# Patient Record
Sex: Female | Born: 2000 | Race: Black or African American | Hispanic: No | Marital: Single | Smoking: Never smoker
Health system: Southern US, Community
[De-identification: ages and names within clinical notes are randomized; demographics above are authoritative.]

## PROBLEM LIST (undated history)

## (undated) DIAGNOSIS — O24419 Gestational diabetes mellitus in pregnancy, unspecified control: Secondary | ICD-10-CM

## (undated) DIAGNOSIS — E669 Obesity, unspecified: Secondary | ICD-10-CM

## (undated) DIAGNOSIS — Z789 Other specified health status: Secondary | ICD-10-CM

## (undated) HISTORY — DX: Other specified health status: Z78.9

## (undated) HISTORY — DX: Obesity, unspecified: E66.9

## (undated) HISTORY — PX: NO PAST SURGERIES: SHX2092

---
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
## 2019-03-02 ENCOUNTER — Emergency Department (HOSPITAL_COMMUNITY): Payer: Medicaid Other

## 2019-03-02 ENCOUNTER — Emergency Department (HOSPITAL_COMMUNITY)
Admission: EM | Admit: 2019-03-02 | Discharge: 2019-03-02 | Disposition: A | Discharge status: Home / Self Care | Payer: Medicaid Other | Attending: Emergency Medicine | Admitting: Emergency Medicine

## 2019-03-02 ENCOUNTER — Telehealth: Payer: Self-pay | Admitting: *Deleted

## 2019-03-02 ENCOUNTER — Other Ambulatory Visit: Payer: Self-pay

## 2019-03-02 DIAGNOSIS — Z79899 Other long term (current) drug therapy: Secondary | ICD-10-CM | POA: Insufficient documentation

## 2019-03-02 DIAGNOSIS — Z8781 Personal history of (healed) traumatic fracture: Secondary | ICD-10-CM | POA: Diagnosis not present

## 2019-03-02 DIAGNOSIS — M25531 Pain in right wrist: Secondary | ICD-10-CM | POA: Insufficient documentation

## 2019-03-02 DIAGNOSIS — M546 Pain in thoracic spine: Secondary | ICD-10-CM | POA: Diagnosis not present

## 2019-03-02 DIAGNOSIS — M25561 Pain in right knee: Secondary | ICD-10-CM | POA: Insufficient documentation

## 2019-03-02 DIAGNOSIS — M545 Low back pain, unspecified: Secondary | ICD-10-CM

## 2019-03-02 DIAGNOSIS — M79631 Pain in right forearm: Secondary | ICD-10-CM | POA: Diagnosis present

## 2019-03-02 DIAGNOSIS — M25539 Pain in unspecified wrist: Secondary | ICD-10-CM

## 2019-03-02 LAB — POC URINE PREG, ED: Preg Test, Ur: NEGATIVE

## 2019-03-02 MED ORDER — METHOCARBAMOL 500 MG PO TABS: 500.0000 mg | ORAL_TABLET | Freq: Two times a day (BID) | ORAL | 0 refills | Status: DC

## 2019-03-02 MED ORDER — METHOCARBAMOL 500 MG PO TABS
500.0000 mg | ORAL_TABLET | Freq: Once | ORAL | Status: AC
  Administered 2019-03-02: 11:00:00 500 mg via ORAL
  Filled 2019-03-02: qty 1

## 2019-03-02 MED ORDER — NAPROXEN 500 MG PO TABS: 500.0000 mg | ORAL_TABLET | Freq: Two times a day (BID) | ORAL | 0 refills | Status: DC

## 2019-03-02 MED ORDER — ACETAMINOPHEN 325 MG PO TABS
650.0000 mg | ORAL_TABLET | Freq: Once | ORAL | Status: AC
  Administered 2019-03-02: 11:00:00 650 mg via ORAL
  Filled 2019-03-02: qty 2
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
## 2019-03-02 NOTE — Telephone Encounter (Signed)
ED TOC CM -  Received call from pt's mother and requesting information on Xray. Pt gave permisison to speak to mother, Jacqueline Yu. Explained she will need to contact ortho MD and scheduled appt. Ortho MD will go over results of exams performed in ED. If she needs a copy of records, she will need to go through [facility] medical records or mychart to obtain records. Isidoro Donning RN CCM Case Mgmt phone [phone]
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
## 2019-03-02 NOTE — ED Notes (Signed)
Patient transported to X-ray 
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
## 2019-03-05 ENCOUNTER — Ambulatory Visit (INDEPENDENT_AMBULATORY_CARE_PROVIDER_SITE_OTHER): Payer: Medicaid Other | Admitting: Family Medicine

## 2019-03-05 ENCOUNTER — Encounter: Payer: Self-pay | Admitting: Family Medicine

## 2019-03-05 ENCOUNTER — Other Ambulatory Visit: Payer: Self-pay

## 2019-03-05 DIAGNOSIS — M25532 Pain in left wrist: Secondary | ICD-10-CM | POA: Diagnosis present
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
## 2019-03-09 ENCOUNTER — Encounter (INDEPENDENT_AMBULATORY_CARE_PROVIDER_SITE_OTHER): Payer: Self-pay | Admitting: Orthopaedic Surgery

## 2019-03-09 ENCOUNTER — Other Ambulatory Visit: Payer: Self-pay

## 2019-03-09 ENCOUNTER — Ambulatory Visit (INDEPENDENT_AMBULATORY_CARE_PROVIDER_SITE_OTHER): Payer: Medicaid Other | Admitting: Orthopaedic Surgery

## 2019-03-09 VITALS — Ht 64.0 in | Wt 209.0 lb

## 2019-03-09 DIAGNOSIS — M25532 Pain in left wrist: Secondary | ICD-10-CM | POA: Insufficient documentation

## 2019-03-09 DIAGNOSIS — M25561 Pain in right knee: Secondary | ICD-10-CM | POA: Diagnosis not present

## 2019-03-09 DIAGNOSIS — M79601 Pain in right arm: Secondary | ICD-10-CM | POA: Diagnosis not present

## 2019-03-09 MED ORDER — METHYLPREDNISOLONE 4 MG PO TBPK: ORAL_TABLET | ORAL | 0 refills | Status: DC

## 2019-03-09 MED ORDER — DICLOFENAC SODIUM 1 % TD GEL: 2.0000 g | Freq: Four times a day (QID) | TRANSDERMAL | 1 refills | Status: DC
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
## 2019-12-09 ENCOUNTER — Telehealth: Payer: Self-pay
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
## 2020-01-15 ENCOUNTER — Telehealth (INDEPENDENT_AMBULATORY_CARE_PROVIDER_SITE_OTHER): Payer: Medicaid Other | Admitting: Family Medicine

## 2020-01-15 ENCOUNTER — Other Ambulatory Visit: Payer: Self-pay

## 2020-01-15 DIAGNOSIS — U071 COVID-19: Secondary | ICD-10-CM

## 2020-01-15 NOTE — Assessment & Plan Note (Signed)
Patient reassured about Covid 19 and given recommendations.  Counseled patient on taking regular Mucinex versus Mucinex DM as she is already taking over-the-counter flu medication.  She expressed understanding.  After brief literature review, there are no published studies that show harm of vitamin D or elderberry during Covid.  Patient to call or MyChart message if she has any further questions.  ED precautions provided.

## 2020-01-15 NOTE — Progress Notes (Signed)
[facility] Telemedicine Visit  Patient consented to have virtual visit. Method of visit: Video  Encounter participants: Patient: Jacqueline Yu - located at Home Provider: Melene Plan - located at [facility] Others (if applicable): Patient's mom   Chief Complaint: Covid diagnosis  HPI:  Patient calling today to ask questions about Covid diagnosis.  She is wondering what medication she can take and what to expect.  She is currently taking over-the-counter flu medication (phenylephrine, diphenhydramine, acetaminophen) as needed for symptom relief.  She is wondering if she can add Mucinex to this combination.  She is also wondering if elderberry and vitamin D are contraindicated during Covid infection.    ROS: per HPI  Pertinent PMHx: No significant past medical history  Exam:  Patient appears well over video.  She has nasal congestion, breathing normally with no acute distress.  Speaking in full sentences.  No coughing appreciated.  Assessment/Plan:  No problem-specific Assessment & Plan notes found for this encounter.    Time spent during visit with patient: 14 minutes

## 2020-03-27 ENCOUNTER — Encounter: Payer: Self-pay | Admitting: Family Medicine

## 2020-03-28 ENCOUNTER — Ambulatory Visit (INDEPENDENT_AMBULATORY_CARE_PROVIDER_SITE_OTHER): Payer: Medicaid Other | Admitting: Family Medicine

## 2020-03-28 ENCOUNTER — Other Ambulatory Visit: Payer: Self-pay

## 2020-03-28 ENCOUNTER — Encounter: Payer: Self-pay | Admitting: Family Medicine

## 2020-03-28 VITALS — BP 138/82 | HR 86 | Ht 64.0 in | Wt 224.8 lb

## 2020-03-28 DIAGNOSIS — Z0000 Encounter for general adult medical examination without abnormal findings: Secondary | ICD-10-CM

## 2020-03-28 DIAGNOSIS — Z131 Encounter for screening for diabetes mellitus: Secondary | ICD-10-CM

## 2020-03-28 DIAGNOSIS — Z111 Encounter for screening for respiratory tuberculosis: Secondary | ICD-10-CM | POA: Diagnosis not present

## 2020-03-28 DIAGNOSIS — Z13220 Encounter for screening for lipoid disorders: Secondary | ICD-10-CM | POA: Diagnosis not present

## 2020-03-28 DIAGNOSIS — Z113 Encounter for screening for infections with a predominantly sexual mode of transmission: Secondary | ICD-10-CM | POA: Diagnosis not present

## 2020-03-28 DIAGNOSIS — Z23 Encounter for immunization: Secondary | ICD-10-CM

## 2020-03-28 NOTE — Patient Instructions (Signed)
You are due to for a tetanus vaccine- [place] will be in to give this  I will call you with blood work results  Prenatal--minimum of 400 mcg of folate (ideally 800 mcg)  It was wonderful to see you today.  Please bring ALL of your medications with you to every visit.   Thank you for choosing [facility].   Please call [phone] with any questions about today's appointment.  Please be sure to schedule follow up at the front  desk before you leave today.   Terisa Starr, MD  Family Medicine
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
## 2020-03-29 LAB — LIPID PANEL
Chol/HDL Ratio: 3.1 ratio (ref 0.0–4.4)
Cholesterol, Total: 196 mg/dL (ref 100–199)
HDL: 64 mg/dL (ref >39)
LDL Chol Calc (NIH): 119 mg/dL — ABNORMAL HIGH (ref 0–99)
Triglycerides: 74 mg/dL (ref 0–149)
VLDL Cholesterol Cal: 13 mg/dL (ref 5–40)

## 2020-03-29 LAB — HEMOGLOBIN A1C
Est. average glucose Bld gHb Est-mCnc: 126 mg/dL
Hgb A1c MFr Bld: 6 % — ABNORMAL HIGH (ref 4.8–5.6)

## 2020-03-29 LAB — HIV ANTIBODY (ROUTINE TESTING W REFLEX): HIV Screen 4th Generation wRfx: NONREACTIVE

## 2020-03-29 LAB — RPR: RPR Ser Ql: NONREACTIVE

## 2020-03-29 LAB — HCV AB W REFLEX TO QUANT PCR: HCV Ab: <0.1 s/co ratio (ref 0.0–0.9)

## 2020-03-29 LAB — HCV INTERPRETATION

## 2020-03-31 ENCOUNTER — Ambulatory Visit: Payer: Medicaid Other

## 2020-03-31 ENCOUNTER — Other Ambulatory Visit: Payer: Self-pay

## 2020-03-31 ENCOUNTER — Telehealth: Payer: Self-pay

## 2020-03-31 DIAGNOSIS — Z111 Encounter for screening for respiratory tuberculosis: Secondary | ICD-10-CM

## 2020-03-31 LAB — TB SKIN TEST
Induration: 0 mm
TB Skin Test: NEGATIVE

## 2020-03-31 NOTE — Telephone Encounter (Signed)
From completed and placed up front for pick up. Patient has been made aware.

## 2020-03-31 NOTE — Progress Notes (Signed)
PPD Reading Note PPD read and results entered in EpicCare. Result: 0 mm induration. Interpretation: Neagtive If test not read within 48-72 hours of initial placement, patient advised to repeat Allergic reaction: no

## 2020-04-25 ENCOUNTER — Other Ambulatory Visit: Payer: Self-pay

## 2020-04-25 ENCOUNTER — Telehealth (INDEPENDENT_AMBULATORY_CARE_PROVIDER_SITE_OTHER): Payer: Medicaid Other | Admitting: Family Medicine

## 2020-04-25 DIAGNOSIS — R030 Elevated blood-pressure reading, without diagnosis of hypertension: Secondary | ICD-10-CM

## 2020-04-25 NOTE — Progress Notes (Signed)
Called patient---she had to work today and wishes to reschedule.  Sent MyChart to reschedule.

## 2020-06-13 ENCOUNTER — Ambulatory Visit (INDEPENDENT_AMBULATORY_CARE_PROVIDER_SITE_OTHER): Payer: Medicaid Other | Admitting: Family Medicine

## 2020-06-13 ENCOUNTER — Encounter: Payer: Self-pay | Admitting: Family Medicine

## 2020-06-13 ENCOUNTER — Other Ambulatory Visit: Payer: Self-pay

## 2020-06-13 VITALS — BP 122/60 | HR 88 | Wt 227.0 lb

## 2020-06-13 DIAGNOSIS — Z3491 Encounter for supervision of normal pregnancy, unspecified, first trimester: Secondary | ICD-10-CM | POA: Insufficient documentation

## 2020-06-13 DIAGNOSIS — O219 Vomiting of pregnancy, unspecified: Secondary | ICD-10-CM | POA: Diagnosis not present

## 2020-06-13 DIAGNOSIS — O99211 Obesity complicating pregnancy, first trimester: Secondary | ICD-10-CM

## 2020-06-13 DIAGNOSIS — Z3201 Encounter for pregnancy test, result positive: Secondary | ICD-10-CM | POA: Diagnosis present

## 2020-06-13 DIAGNOSIS — O99210 Obesity complicating pregnancy, unspecified trimester: Secondary | ICD-10-CM | POA: Insufficient documentation

## 2020-06-13 DIAGNOSIS — Z3401 Encounter for supervision of normal first pregnancy, first trimester: Secondary | ICD-10-CM

## 2020-06-13 LAB — POCT URINALYSIS DIP (MANUAL ENTRY)
Bilirubin, UA: NEGATIVE
Blood, UA: NEGATIVE
Glucose, UA: NEGATIVE mg/dL
Ketones, POC UA: NEGATIVE mg/dL
Leukocytes, UA: NEGATIVE
Nitrite, UA: NEGATIVE
Protein Ur, POC: NEGATIVE mg/dL
Spec Grav, UA: 1.025 (ref 1.010–1.025)
Urobilinogen, UA: 0.2 E.U./dL
pH, UA: 6 (ref 5.0–8.0)

## 2020-06-13 LAB — POCT URINE PREGNANCY: Preg Test, Ur: POSITIVE — AB

## 2020-06-13 MED ORDER — DOXYLAMINE-PYRIDOXINE 10-10 MG PO TBEC: DELAYED_RELEASE_TABLET | ORAL | 3 refills | Status: DC
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
## 2020-06-13 NOTE — Patient Instructions (Addendum)
It was wonderful to see you today.  Please bring ALL of your medications with you to every visit.   Today we talked about:  - Schedule an 'Initial Ob visit for 60 minutes' at the front - Keep  Taking your prenatal - I sent a nausea medication to your pharmacy  - I encourage you and your family to get the COVID vaccine     Thank you for choosing [facility].   Please call [phone] with any questions about today's appointment.  Please be sure to schedule follow up at the front  desk before you leave today.   Terisa Starr, MD  Family Medicine       First Trimester of Pregnancy  The first trimester of pregnancy is from week 1 until the end of week 13 (months 1 through 3). During this time, your baby will begin to develop inside you. At 6-8 weeks, the eyes and face are formed, and the heartbeat can be seen on ultrasound. At the end of 12 weeks, all the baby's organs are formed. Prenatal care is all the medical care you receive before the birth of your baby. Make sure you get good prenatal care and follow all of your doctor's instructions. Follow these instructions at home: Medicines  Take over-the-counter and prescription medicines only as told by your doctor. Some medicines are safe and some medicines are not safe during pregnancy.  Take a prenatal vitamin that contains at least 600 micrograms (mcg) of folic acid.  If you have trouble pooping (constipation), take medicine that will make your stool soft (stool softener) if your doctor approves. Eating and drinking   Eat regular, healthy meals.  Your doctor will tell you the amount of weight gain that is right for you.  Avoid raw meat and uncooked cheese.  If you feel sick to your stomach (nauseous) or throw up (vomit): ? Eat 4 or 5 small meals a day instead of 3 large meals. ? Try eating a few soda crackers. ? Drink liquids between meals instead of during meals.  To prevent constipation: ? Eat foods  that are high in fiber, like fresh fruits and vegetables, whole grains, and beans. ? Drink enough fluids to keep your pee (urine) clear or pale yellow. Activity  Exercise only as told by your doctor. Stop exercising if you have cramps or pain in your lower belly (abdomen) or low back.  Do not exercise if it is too hot, too humid, or if you are in a place of great height (high altitude).  Try to avoid standing for long periods of time. Move your legs often if you must stand in one place for a long time.  Avoid heavy lifting.  Wear low-heeled shoes. Sit and stand up straight.  You can have sex unless your doctor tells you not to. Relieving pain and discomfort  Wear a good support bra if your breasts are sore.  Take warm water baths (sitz baths) to soothe pain or discomfort caused by hemorrhoids. Use hemorrhoid cream if your doctor says it is okay.  Rest with your legs raised if you have leg cramps or low back pain.  If you have puffy, bulging veins (varicose veins) in your legs: ? Wear support hose or compression stockings as told by your doctor. ? Raise (elevate) your feet for 15 minutes, 3-4 times a day. ? Limit salt in your food. Prenatal care  Schedule your prenatal visits by the twelfth week of pregnancy.  Write down  your questions. Take them to your prenatal visits.  Keep all your prenatal visits as told by your doctor. This is important. Safety  Wear your seat belt at all times when driving.  Make a list of emergency phone numbers. The list should include numbers for family, friends, the hospital, and police and fire departments. General instructions  Ask your doctor for a referral to a local prenatal class. Begin classes no later than at the start of month 6 of your pregnancy.  Ask for help if you need counseling or if you need help with nutrition. Your doctor can give you advice or tell you where to go for help.  Do not use hot tubs, steam rooms, or saunas.  Do  not douche or use tampons or scented sanitary pads.  Do not cross your legs for long periods of time.  Avoid all herbs and alcohol. Avoid drugs that are not approved by your doctor.  Do not use any tobacco products, including cigarettes, chewing tobacco, and electronic cigarettes. If you need help quitting, ask your doctor. You may get counseling or other support to help you quit.  Avoid cat litter boxes and soil used by cats. These carry germs that can cause birth defects in the baby and can cause a loss of your baby (miscarriage) or stillbirth.  Visit your dentist. At home, brush your teeth with a soft toothbrush. Be gentle when you floss. Contact a doctor if:  You are dizzy.  You have mild cramps or pressure in your lower belly.  You have a nagging pain in your belly area.  You continue to feel sick to your stomach, you throw up, or you have watery poop (diarrhea).  You have a bad smelling fluid coming from your vagina.  You have pain when you pee (urinate).  You have increased puffiness (swelling) in your face, hands, legs, or ankles. Get help right away if:  You have a fever.  You are leaking fluid from your vagina.  You have spotting or bleeding from your vagina.  You have very bad belly cramping or pain.  You gain or lose weight rapidly.  You throw up blood. It may look like coffee grounds.  You are around people who have [place] measles, fifth disease, or chickenpox.  You have a very bad headache.  You have shortness of breath.  You have any kind of trauma, such as from a fall or a car accident. Summary  The first trimester of pregnancy is from week 1 until the end of week 13 (months 1 through 3).  To take care of yourself and your unborn baby, you will need to eat healthy meals, take medicines only if your doctor tells you to do so, and do activities that are safe for you and your baby.  Keep all follow-up visits as told by your doctor. This is important  as your doctor will have to ensure that your baby is healthy and growing well. This information is not intended to replace advice given to you by your health care provider. Make sure you discuss any questions you have with your health care provider. Document Revised: 02/19/2019 Document Reviewed: 11/06/2016 Elsevier Patient Education  2020 [employer].

## 2020-06-14 LAB — OBSTETRIC PANEL, INCLUDING HIV
Antibody Screen: NEGATIVE
Basophils Absolute: 0 10*3/uL (ref 0.0–0.2)
Basos: 1 %
EOS (ABSOLUTE): 0.1 10*3/uL (ref 0.0–0.4)
Eos: 2 %
HIV Screen 4th Generation wRfx: NONREACTIVE
Hematocrit: 40.2 % (ref 34.0–46.6)
Hemoglobin: 12.6 g/dL (ref 11.1–15.9)
Hepatitis B Surface Ag: NEGATIVE
Immature Grans (Abs): 0 10*3/uL (ref 0.0–0.1)
Immature Granulocytes: 0 %
Lymphocytes Absolute: 2.2 10*3/uL (ref 0.7–3.1)
Lymphs: 42 %
MCH: 27 pg (ref 26.6–33.0)
MCHC: 31.3 g/dL — ABNORMAL LOW (ref 31.5–35.7)
MCV: 86 fL (ref 79–97)
Monocytes Absolute: 0.3 10*3/uL (ref 0.1–0.9)
Monocytes: 6 %
Neutrophils Absolute: 2.6 10*3/uL (ref 1.4–7.0)
Neutrophils: 49 %
Platelets: 392 10*3/uL (ref 150–450)
RBC: 4.67 x10E6/uL (ref 3.77–5.28)
RDW: 13.8 % (ref 11.7–15.4)
RPR Ser Ql: NONREACTIVE
Rh Factor: POSITIVE
Rubella Antibodies, IGG: 5.82 index (ref >0.99)
WBC: 5.4 10*3/uL (ref 3.4–10.8)

## 2020-06-14 LAB — HCV INTERPRETATION

## 2020-06-14 LAB — HGB FRACTIONATION CASCADE
Hgb A2: 2.6 % (ref 1.8–3.2)
Hgb A: 97.4 % (ref 96.4–98.8)
Hgb F: 0 % (ref 0.0–2.0)
Hgb S: 0 %

## 2020-06-14 LAB — HCV AB W REFLEX TO QUANT PCR: HCV Ab: <0.1 s/co ratio (ref 0.0–0.9)

## 2020-06-15 ENCOUNTER — Other Ambulatory Visit: Payer: Self-pay

## 2020-06-15 ENCOUNTER — Telehealth: Payer: Self-pay

## 2020-06-15 ENCOUNTER — Inpatient Hospital Stay (HOSPITAL_COMMUNITY)
Admission: AD | Admit: 2020-06-15 | Discharge: 2020-06-15 | Disposition: A | Discharge status: Home / Self Care | Payer: Medicaid Other | Attending: Family Medicine | Admitting: Family Medicine

## 2020-06-15 ENCOUNTER — Inpatient Hospital Stay (HOSPITAL_COMMUNITY): Payer: Medicaid Other

## 2020-06-15 ENCOUNTER — Encounter (HOSPITAL_COMMUNITY): Payer: Self-pay | Admitting: Family Medicine

## 2020-06-15 DIAGNOSIS — O3680X0 Pregnancy with inconclusive fetal viability, not applicable or unspecified: Secondary | ICD-10-CM | POA: Diagnosis not present

## 2020-06-15 DIAGNOSIS — O209 Hemorrhage in early pregnancy, unspecified: Secondary | ICD-10-CM | POA: Diagnosis present

## 2020-06-15 DIAGNOSIS — Z3A01 Less than 8 weeks gestation of pregnancy: Secondary | ICD-10-CM | POA: Diagnosis not present

## 2020-06-15 DIAGNOSIS — O4691 Antepartum hemorrhage, unspecified, first trimester: Secondary | ICD-10-CM | POA: Diagnosis not present

## 2020-06-15 DIAGNOSIS — N939 Abnormal uterine and vaginal bleeding, unspecified: Secondary | ICD-10-CM

## 2020-06-15 LAB — URINALYSIS, ROUTINE W REFLEX MICROSCOPIC
Bilirubin Urine: NEGATIVE
Glucose, UA: NEGATIVE mg/dL
Ketones, ur: NEGATIVE mg/dL
Leukocytes,Ua: NEGATIVE
Nitrite: NEGATIVE
Protein, ur: NEGATIVE mg/dL
Specific Gravity, Urine: 1.028 (ref 1.005–1.030)
pH: 6 (ref 5.0–8.0)

## 2020-06-15 LAB — WET PREP, GENITAL
Sperm: NONE SEEN
Trich, Wet Prep: NONE SEEN
Yeast Wet Prep HPF POC: NONE SEEN

## 2020-06-15 LAB — CULTURE, OB URINE

## 2020-06-15 LAB — HCG, QUANTITATIVE, PREGNANCY: hCG, Beta Chain, Quant, S: 21 m[IU]/mL — ABNORMAL HIGH (ref <5)

## 2020-06-15 LAB — URINE CULTURE, OB REFLEX

## 2020-06-15 MED ORDER — ACETAMINOPHEN 500 MG PO TABS
1000.0000 mg | ORAL_TABLET | ORAL | Status: AC
  Administered 2020-06-15: 1000 mg via ORAL
  Filled 2020-06-15: qty 2

## 2020-06-15 NOTE — Progress Notes (Signed)
GC/Chlamydia & Wet prep cultures collected by this RN.

## 2020-06-15 NOTE — Telephone Encounter (Signed)
Agree with evaluation at MAU.   Terisa Starr, MD  Family Medicine Teaching Service

## 2020-06-15 NOTE — Telephone Encounter (Signed)
Patient calls nurse line reporting increased bleeding since last night. Patient reports she is [redacted] weeks pregnant and noticed some spotting yesterday evening. Patient reports she tried calling our office, however we were closed. Patient reports the bleeding has increased significantly and now a "regular" period. Patient does endorse cramping. Patient advised to go to MAU for further evaluation. Directions given.

## 2020-06-15 NOTE — MAU Note (Signed)
Pt reports bleeding since yesterday that was spotting. Became heavier with cramping at around 0545 this morning.

## 2020-06-15 NOTE — Discharge Instructions (Signed)
Please return to this unit on Friday at 11AM for a repeat blood test.   You are welcome to make a follow up appointment with our office, located at [address].

## 2020-06-15 NOTE — MAU Provider Note (Signed)
History     CSN: [id number]  Arrival date and time: 06/15/20 1007   None     Chief Complaint  Patient presents with   Vaginal Bleeding   Abdominal Cramping   HPI  Patient is a g1p0 at approx 5wks who presents with vaginal bleeding. She has not had an [place] confirming IUP yet. She reports that she started having some spotting last night and it developed into heavier bleeding this morning with associated cramping. She reports that bleeding is like her period. She has had to change her pad once today. She denies any large clots. Denies light headedness or dizziness. No headaches, fevers, chills. No new sexual partners. No abnormal vaginal discharge.   This is a desired pregnancy.    Past Medical History:  Diagnosis Date   Medical history non-contributory    Obesity     Past Surgical History:  Procedure Laterality Date   NO PAST SURGERIES      Family History  Problem Relation Age of Onset   Hypertension Mother    Hyperlipidemia Mother    Migraines Mother    Anxiety disorder Mother    Schizophrenia Brother    Depression Brother    Anxiety disorder Brother    Schizophrenia Maternal Grandmother    Depression Maternal Grandmother    Anxiety disorder Maternal Grandmother     Social History   Tobacco Use   Smoking status: Never Smoker   Smokeless tobacco: Never Used  Building services engineer Use: Never used  Substance Use Topics   Alcohol use: Not Currently   Drug use: Not Currently    Types: Marijuana    Allergies:  Allergies  Allergen Reactions   Peanuts [Peanut Oil]     Scratchy throat    Medications Prior to Admission  Medication Sig Dispense Refill Last Dose   Prenatal Vit-Fe Fumarate-FA (MULTIVITAMIN-PRENATAL) 27-0.8 MG TABS tablet Take 1 tablet by mouth daily at 12 noon.   06/14/2020 at Unknown time   Doxylamine-Pyridoxine (DICLEGIS) 10-10 MG TBEC Take 1 tablet nightly for 3 days then can increase to 1 twice daily 60 tablet 3     ibuprofen (ADVIL,MOTRIN) 600 MG tablet Take 1 tablet (600 mg total) by mouth every 6 (six) hours as needed for mild pain. (Patient not taking: Reported on 03/09/2019) 30 tablet 0     Review of Systems  Constitutional: Negative for appetite change and chills.  Respiratory: Negative for shortness of breath.   Cardiovascular: Negative for chest pain.  Genitourinary: Negative for dysuria.  Neurological: Negative for dizziness.   Physical Exam   Blood pressure 132/88, pulse 74, temperature 98.8 F (37.1 C), temperature source Oral, resp. rate 18, height 5' 4.5" (1.638 m), weight 103.7 kg, last menstrual period 05/12/2020, SpO2 100 %.  Physical Exam Vitals and nursing note reviewed.  Constitutional:      Appearance: Normal appearance.  HENT:     Head: Normocephalic and atraumatic.  Cardiovascular:     Pulses: Normal pulses.  Pulmonary:     Effort: Pulmonary effort is normal.  Abdominal:     General: Bowel sounds are normal.     Palpations: Abdomen is soft.     Comments: Mild suprapubic tenderness  Skin:    General: Skin is warm and dry.  Neurological:     Mental Status: She is alert.  Psychiatric:        Mood and Affect: Mood normal.        Behavior: Behavior normal.  MAU Course  Procedures  MDM PE performed UA neg  HCG 21 TVUS w no evidence of IUP  Tylenol 1000 mg  GC/wet prep obtained by RN   Assessment and Plan   Pregnancy of unknown location  -needs repeat HCG in 48 hours -provided emotional support -d/c from MAU   Gita Kudo 06/15/2020, 11:17 AM

## 2020-06-16 LAB — GC/CHLAMYDIA PROBE AMP (~~LOC~~) NOT AT ARMC
Chlamydia: NEGATIVE
Comment: NEGATIVE
Comment: NORMAL
Neisseria Gonorrhea: NEGATIVE

## 2020-06-17 ENCOUNTER — Inpatient Hospital Stay (HOSPITAL_COMMUNITY)
Admission: AD | Admit: 2020-06-17 | Discharge: 2020-06-17 | Disposition: A | Discharge status: Home / Self Care | Payer: Medicaid Other | Attending: Family Medicine | Admitting: Family Medicine

## 2020-06-17 ENCOUNTER — Other Ambulatory Visit: Payer: Self-pay

## 2020-06-17 DIAGNOSIS — Z3A01 Less than 8 weeks gestation of pregnancy: Secondary | ICD-10-CM | POA: Insufficient documentation

## 2020-06-17 DIAGNOSIS — O039 Complete or unspecified spontaneous abortion without complication: Secondary | ICD-10-CM | POA: Diagnosis not present

## 2020-06-17 DIAGNOSIS — Z6790 Unspecified blood type, Rh positive: Secondary | ICD-10-CM

## 2020-06-17 LAB — HCG, QUANTITATIVE, PREGNANCY: hCG, Beta Chain, Quant, S: 4 m[IU]/mL (ref <5)

## 2020-06-17 NOTE — Discharge Instructions (Signed)
Miscarriage A miscarriage is the loss of an unborn baby (fetus) before the 20th week of pregnancy. Most miscarriages happen during the first 3 months of pregnancy. Sometimes, a miscarriage can happen before a woman knows that she is pregnant. Having a miscarriage can be an emotional experience. If you have had a miscarriage, talk with your health care provider about any questions you may have about miscarrying, the grieving process, and your plans for future pregnancy. What are the causes? A miscarriage may be caused by:  Problems with the genes or chromosomes of the fetus. These problems make it impossible for the baby to develop normally. They are often the result of random errors that occur early in the development of the baby, and are not passed from parent to child (not inherited).  Infection of the cervix or uterus.  Conditions that affect hormone balance in the body.  Problems with the cervix, such as the cervix opening and thinning before pregnancy is at term (cervical insufficiency).  Problems with the uterus. These may include: ? A uterus with an abnormal shape. ? Fibroids in the uterus. ? Congenital abnormalities. These are problems that were present at birth.  Certain medical conditions.  Smoking, drinking alcohol, or using drugs.  Injury (trauma). In many cases, the cause of a miscarriage is not known. What are the signs or symptoms? Symptoms of this condition include:  Vaginal bleeding or spotting, with or without cramps or pain.  Pain or cramping in the abdomen or lower back.  Passing fluid, tissue, or blood clots from the vagina. How is this diagnosed? This condition may be diagnosed based on:  A physical exam.  Ultrasound.  Blood tests.  Urine tests. How is this treated? Treatment for a miscarriage is sometimes not necessary if you naturally pass all the tissue that was in your uterus. If necessary, this condition may be treated with:  Dilation and  curettage (D&C). This is a procedure in which the cervix is stretched open and the lining of the uterus (endometrium) is scraped. This is done only if tissue from the fetus or placenta remains in the body (incomplete miscarriage).  Medicines, such as: ? Antibiotic medicine, to treat infection. ? Medicine to help the body pass any remaining tissue. ? Medicine to reduce (contract) the size of the uterus. These medicines may be given if you have a lot of bleeding. If you have Rh negative blood and your baby was Rh positive, you will need a shot of a medicine called Rh immunoglobulinto protect your future babies from Rh blood problems. "Rh-negative" and "Rh-positive" refer to whether or not the blood has a specific protein found on the surface of red blood cells (Rh factor). Follow these instructions at home: Medicines   Take over-the-counter and prescription medicines only as told by your health care provider.  If you were prescribed antibiotic medicine, take it as told by your health care provider. Do not stop taking the antibiotic even if you start to feel better.  Do not take NSAIDs, such as aspirin and ibuprofen, unless they are approved by your health care provider. These medicines can cause bleeding. Activity  Rest as directed. Ask your health care provider what activities are safe for you.  Have someone help with home and family responsibilities during this time. General instructions  Keep track of the number of sanitary pads you use each day and how soaked (saturated) they are. Write down this information.  Monitor the amount of tissue or blood clots that   you pass from your vagina. Save any large amounts of tissue for your health care provider to examine.  Do not use tampons, douche, or have sex until your health care provider approves.  To help you and your partner with the process of grieving, talk with your health care provider or seek counseling.  When you are ready, meet with  your health care provider to discuss any important steps you should take for your health. Also, discuss steps you should take to have a healthy pregnancy in the future.  Keep all follow-up visits as told by your health care provider. This is important. Where to find more information  The American Congress of Obstetricians and Gynecologists: www.acog.org  [place] Department of Health and Human Services Office of Women's Health: www.womenshealth.gov Contact a health care provider if:  You have a fever or chills.  You have a foul smelling vaginal discharge.  You have more bleeding instead of less. Get help right away if:  You have severe cramps or pain in your back or abdomen.  You pass blood clots or tissue from your vagina that is walnut-sized or larger.  You soak more than 1 regular sanitary pad in an hour.  You become light-headed or weak.  You pass out.  You have feelings of sadness that take over your thoughts, or you have thoughts of hurting yourself. Summary  Most miscarriages happen in the first 3 months of pregnancy. Sometimes miscarriage happens before a woman even knows that she is pregnant.  Follow your health care provider's instruction for home care. Keep all follow-up appointments.  To help you and your partner with the process of grieving, talk with your health care provider or seek counseling. This information is not intended to replace advice given to you by your health care provider. Make sure you discuss any questions you have with your health care provider. Document Revised: 02/20/2019 Document Reviewed: 12/04/2016 Elsevier Patient Education  2020 Elsevier Inc.   Managing Pregnancy Loss Pregnancy loss can happen any time during a pregnancy. Often the cause is not known. It is rarely because of anything you did. Pregnancy loss in early pregnancy (during the first trimester) is called a miscarriage. This type of pregnancy loss is the most common. Pregnancy loss  that happens after 20 weeks of pregnancy is called fetal demise if the baby's heart stops beating before birth. Fetal demise is much less common. Some women experience spontaneous labor shortly after fetal demise resulting in a stillborn birth (stillbirth). Any pregnancy loss can be devastating. You will need to recover both physically and emotionally. Most women are able to get pregnant again after a pregnancy loss and deliver a healthy baby. How to manage emotional recovery  Pregnancy loss is very hard emotionally. You may feel many different emotions while you grieve. You may feel sad and angry. You may also feel guilty. It is normal to have periods of crying. Emotional recovery can take longer than physical recovery. It is different for everyone. Taking these steps can help you in managing this loss:  Remember that it is unlikely you did anything to cause the pregnancy loss.  Share your thoughts and feelings with friends, family, and your partner. Remember that your partner is also recovering emotionally.  Make sure you have a good support system. Do not spend too much time alone.  Meet with a pregnancy loss counselor or join a pregnancy loss support group.  Get enough sleep and eat a healthy diet. Return to regular exercise   when you have recovered physically.  Do not use drugs or alcohol to manage your emotions.  Consider seeing a mental health professional to help you recover emotionally.  Ask a friend or loved one to help you decide what to do with any clothing and nursery items you received for your baby. In the case of a stillbirth, many women benefit from taking additional steps in the grieving process. You may want to:  Hold your baby after the birth.  Name your baby.  Request a birth certificate.  Create a keepsake such as handprints or footprints.  Dress your baby and have a picture taken.  Make funeral arrangements.  Ask for a baptism or blessing. Hospitals have  staff members who can help you with all these arrangements. How to recognize emotional stress It is normal to have emotional stress after a pregnancy loss. But emotional stress that lasts a long time or becomes severe requires treatment. Watch out for these signs of severe emotional stress:  Sadness, anger, or guilt that is not going away and is interfering with your normal activities.  Relationship problems that have occurred or gotten worse since the pregnancy loss.  Signs of depression that last longer than 2 weeks. These may include: ? Sadness. ? Anxiety. ? Hopelessness. ? Loss of interest in activities you enjoy. ? Inability to concentrate. ? Trouble sleeping or sleeping too much. ? Loss of appetite or overeating. ? Thoughts of death or of hurting yourself. Follow these instructions at home:  Take over-the-counter and prescription medicines only as told by your health care provider.  Rest at home until your energy level returns. Return to your normal activities as told by your health care provider. Ask your health care provider what activities are safe for you.  When you are ready, meet with your health care provider to discuss steps to take for a future pregnancy.  Keep all follow-up visits as told by your health care provider. This is important. Where to find support  To help you and your partner with the process of grieving, talk with your health care provider or seek counseling.  Consider meeting with others who have experienced pregnancy loss. Ask your health care provider about support groups and resources. Where to find more information  [place] Department of Health and Human Services Office on Women's Health: www.womenshealth.gov  American Pregnancy Association: www.americanpregnancy.org Contact a health care provider if:  You continue to experience grief, sadness, or lack of motivation for everyday activities, and those feelings do not improve over time.  You are  struggling to recover emotionally, especially if you are using alcohol or substances to help. Get help right away if:  You have thoughts of hurting yourself or others. If you ever feel like you may hurt yourself or others, or have thoughts about taking your own life, get help right away. You can go to your nearest emergency department or call:  Your local emergency services (911 in the [place]  A suicide crisis helpline, such as the National Suicide Prevention Lifeline at 1-800-273-8255. This is open 24 hours a day. Summary  Any pregnancy loss can be difficult physically and emotionally.  You may experience many different emotions while you grieve. Emotional recovery can last longer than physical recovery.  It is normal to have emotional stress after a pregnancy loss. But emotional stress that lasts a long time or becomes severe requires treatment.  See your health care provider if you are struggling emotionally after a pregnancy loss. This information   is not intended to replace advice given to you by your health care provider. Make sure you discuss any questions you have with your health care provider. Document Revised: 02/18/2019 Document Reviewed: 01/09/2018 Elsevier Patient Education  2020 Elsevier Inc.  

## 2020-06-17 NOTE — [facility] Provider Note (Signed)
Subjective:  Jacqueline Yu is a 19 y.o. G1P0000 at [redacted]w[redacted]d who presents today for FU BHCG. She was seen on 06/15/2020. Results from that day show no IUP on [place], and HCG 21. She reports vaginal bleeding that is like a period. She denies abdominal or pelvic pain.   Objective:  Physical Exam  Nursing note and vitals reviewed.  Patient Vitals for the past 24 hrs:  BP Temp Temp src Pulse Resp  06/17/20 1115 134/76 98.6 F (37 C) Oral 93 18   Constitutional: She is oriented to person, place, and time. She appears well-developed and well-nourished. No distress.  HENT:  Head: Normocephalic.  Cardiovascular: Normal rate.  Respiratory: Effort normal.  GI: Soft. There is no tenderness.  Neurological: She is alert and oriented to person, place, and time. Skin: Skin is warm and dry.  Psychiatric: She has a normal mood and affect.   Results for orders placed or performed during the hospital encounter of 06/17/20 (from the past 24 hour(s))  hCG, quantitative, pregnancy     Status: None   Collection Time: 06/17/20 11:16 AM  Result Value Ref Range   hCG, Beta Chain, Quant, S 4 <5 mIU/mL    Assessment/Plan: Miscarriage RH positive HCG now 4, non-pregnant Message sent to [facility] to change future visit from NOB to SAB f/u Pt discharged to home in stable condition  Corey Laski, Odie Sera, NP  12:33 PM 06/17/2020

## 2020-06-17 NOTE — MAU Note (Signed)
Here for repeat blood work.  Is "doing ok'.  Bleeding is like a regular period. Some cramping, mild

## 2020-06-28 ENCOUNTER — Encounter: Payer: Self-pay | Admitting: Family Medicine

## 2020-06-28 ENCOUNTER — Other Ambulatory Visit: Payer: Self-pay

## 2020-06-28 ENCOUNTER — Ambulatory Visit: Payer: Medicaid Other | Admitting: Family Medicine

## 2020-06-28 VITALS — BP 112/66 | HR 75 | Wt 226.0 lb

## 2020-06-28 DIAGNOSIS — O039 Complete or unspecified spontaneous abortion without complication: Secondary | ICD-10-CM | POA: Diagnosis not present

## 2020-06-28 DIAGNOSIS — B9689 Other specified bacterial agents as the cause of diseases classified elsewhere: Secondary | ICD-10-CM

## 2020-06-28 DIAGNOSIS — N760 Acute vaginitis: Secondary | ICD-10-CM

## 2020-06-28 MED ORDER — METRONIDAZOLE 500 MG PO TABS: 500.0000 mg | ORAL_TABLET | Freq: Two times a day (BID) | ORAL | 0 refills | Status: AC
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
## 2020-06-28 NOTE — Patient Instructions (Addendum)
It was very nice to meet you today. Today you were seen for a follow-up visit.   Please go to [email or web address] for miscarriage grief counseling.  Counseling is also offered to [facility].  Please make an appointment for your Pap smear at your earliest convenience.  Please continue to take a prenatal vitamin.  Please take Flagyl 500 mg twice daily for total of 7 days to treat bacterial vaginosis.  Strongly consider receiving your Covid vaccine.  Please call the clinic at [phone] if you have any concerns. It was our pleasure to serve you.

## 2020-06-30 DIAGNOSIS — B9689 Other specified bacterial agents as the cause of diseases classified elsewhere: Secondary | ICD-10-CM | POA: Insufficient documentation

## 2020-06-30 DIAGNOSIS — O039 Complete or unspecified spontaneous abortion without complication: Secondary | ICD-10-CM | POA: Insufficient documentation

## 2020-06-30 NOTE — Assessment & Plan Note (Signed)
Reviewed wet prep from 06/15/20. Patient no longer with vaginal bleeding but having fishy vaginal odor and discharge. Did not received tx for BV -Flagyl 500 mg BID x 7 days -Follow up as need or if symptoms fail to resolve.

## 2020-06-30 NOTE — Assessment & Plan Note (Signed)
Complete by testing personally reviewed above. Patient trying to concieve.  -Continue prenatal -Grief counseling resource given in AVS: [email or web address] and [facility] both of which have miscarriage grief counseling -Follow up as needed

## 2020-07-04 ENCOUNTER — Encounter: Payer: Medicaid Other | Admitting: Family Medicine

## 2020-08-16 ENCOUNTER — Ambulatory Visit (INDEPENDENT_AMBULATORY_CARE_PROVIDER_SITE_OTHER): Payer: Medicaid Other | Admitting: Family Medicine

## 2020-08-16 ENCOUNTER — Encounter: Payer: Self-pay | Admitting: Family Medicine

## 2020-08-16 ENCOUNTER — Other Ambulatory Visit: Payer: Self-pay

## 2020-08-16 VITALS — BP 122/78 | HR 80 | Ht 64.5 in | Wt 229.0 lb

## 2020-08-16 DIAGNOSIS — N912 Amenorrhea, unspecified: Secondary | ICD-10-CM

## 2020-08-16 DIAGNOSIS — Z331 Pregnant state, incidental: Secondary | ICD-10-CM

## 2020-08-16 LAB — POCT URINE PREGNANCY: Preg Test, Ur: POSITIVE — AB

## 2020-08-16 NOTE — Patient Instructions (Signed)
Eating Plan for Pregnant Women While you are pregnant, your body requires additional nutrition to help support your growing baby. You also have a higher need for some vitamins and minerals, such as folic acid, calcium, iron, and vitamin D. Eating a healthy, well-balanced diet is very important for your health and your baby's health. Your need for extra calories varies for the three 3-month segments of your pregnancy (trimesters). For most women, it is recommended to consume:  150 extra calories a day during the first trimester.  300 extra calories a day during the second trimester.  300 extra calories a day during the third trimester. What are tips for following this plan?   Do not try to lose weight or go on a diet during pregnancy.  Limit your overall intake of foods that have "empty calories." These are foods that have little nutritional value, such as sweets, desserts, candies, and sugar-sweetened beverages.  Eat a variety of foods (especially fruits and vegetables) to get a full range of vitamins and minerals.  Take a prenatal vitamin to help meet your additional vitamin and mineral needs during pregnancy, specifically for folic acid, iron, calcium, and vitamin D.  Remember to stay active. Ask your health care provider what types of exercise and activities are safe for you.  Practice good food safety and cleanliness. Wash your hands before you eat and after you prepare raw meat. Wash all fruits and vegetables well before peeling or eating. Taking these actions can help to prevent food-borne illnesses that can be very dangerous to your baby, such as listeriosis. Ask your health care provider for more information about listeriosis. What does 150 extra calories look like? Healthy options that provide 150 extra calories each day could be any of the following:  6-8 oz (170-230 g) of plain low-fat yogurt with  cup of berries.  1 apple with 2 teaspoons (11 g) of peanut butter.  Cut-up  vegetables with  cup (60 g) of hummus.  8 oz (230 mL) or 1 cup of low-fat chocolate milk.  1 stick of string cheese with 1 medium orange.  1 peanut butter and jelly sandwich that is made with one slice of whole-wheat bread and 1 tsp (5 g) of peanut butter. For 300 extra calories, you could eat two of those healthy options each day. What is a healthy amount of weight to gain? The right amount of weight gain for you is based on your BMI before you became pregnant. If your BMI:  Was less than 18 (underweight), you should gain 28-40 lb (13-18 kg).  Was 18-24.9 (normal), you should gain 25-35 lb (11-16 kg).  Was 25-29.9 (overweight), you should gain 15-25 lb (7-11 kg).  Was 30 or greater (obese), you should gain 11-20 lb (5-9 kg). What if I am having twins or multiples? Generally, if you are carrying twins or multiples:  You may need to eat 300-600 extra calories a day.  The recommended range for total weight gain is 25-54 lb (11-25 kg), depending on your BMI before pregnancy.  Talk with your health care provider to find out about nutritional needs, weight gain, and exercise that is right for you. What foods can I eat?  Fruits All fruits. Eat a variety of colors and types of fruit. Remember to wash your fruits well before peeling or eating. Vegetables All vegetables. Eat a variety of colors and types of vegetables. Remember to wash your vegetables well before peeling or eating. Grains All grains. Choose whole grains, such   as whole-wheat bread, oatmeal, or brown rice. Meats and other protein foods Lean meats, including chicken, turkey, fish, and lean cuts of beef, veal, or pork. If you eat fish or seafood, choose options that are higher in omega-3 fatty acids and lower in mercury, such as salmon, herring, mussels, trout, sardines, pollock, shrimp, crab, and lobster. Tofu. Tempeh. Beans. Eggs. Peanut butter and other nut butters. Make sure that all meats, poultry, and eggs are cooked to  food-safe temperatures or "well-done." Two or more servings of fish are recommended each week in order to get the most benefits from omega-3 fatty acids that are found in seafood. Choose fish that are lower in mercury. You can find more information online:  www.fda.gov Dairy Pasteurized milk and milk alternatives (such as almond milk). Pasteurized yogurt and pasteurized cheese. Cottage cheese. Sour cream. Beverages Water. Juices that contain 100% fruit juice or vegetable juice. Caffeine-free teas and decaffeinated coffee. Drinks that contain caffeine are okay to drink, but it is better to avoid caffeine. Keep your total caffeine intake to less than 200 mg each day (which is 12 oz or 355 mL of coffee, tea, or soda) or the limit as told by your health care provider. Fats and oils Fats and oils are okay to include in moderation. Sweets and desserts Sweets and desserts are okay to include in moderation. Seasoning and other foods All pasteurized condiments. The items listed above may not be a complete list of foods and beverages you can eat. Contact a dietitian for more information. What foods are not recommended? Fruits Unpasteurized fruit juices. Vegetables Raw (unpasteurized) vegetable juices. Meats and other protein foods Lunch meats, bologna, hot dogs, or other deli meats. (If you must eat those meats, reheat them until they are steaming hot.) Refrigerated pat, meat spreads from a meat counter, smoked seafood that is found in the refrigerated section of a store. Raw or undercooked meats, poultry, and eggs. Raw fish, such as sushi or sashimi. Fish that have high mercury content, such as tilefish, shark, swordfish, and king mackerel. To learn more about mercury in fish, talk with your health care provider or look for online resources, such as:  www.fda.gov Dairy Raw (unpasteurized) milk and any foods that have raw milk in them. Soft cheeses, such as feta, queso blanco, queso fresco, Brie,  Camembert cheeses, blue-veined cheeses, and Panela cheese (unless it is made with pasteurized milk, which must be stated on the label). Beverages Alcohol. Sugar-sweetened beverages, such as sodas, teas, or energy drinks. Seasoning and other foods Homemade fermented foods and drinks, such as pickles, sauerkraut, or kombucha drinks. (Store-bought pasteurized versions of these are okay.) Salads that are made in a store or deli, such as ham salad, chicken salad, egg salad, tuna salad, and seafood salad. The items listed above may not be a complete list of foods and beverages you should avoid. Contact a dietitian for more information. Where to find more information To calculate the number of calories you need based on your height, weight, and activity level, you can use an online calculator such as:  www.choosemyplate.gov/MyPlatePlan To calculate how much weight you should gain during pregnancy, you can use an online pregnancy weight gain calculator such as:  www.choosemyplate.gov/pregnancy-weight-gain-calculator Summary  While you are pregnant, your body requires additional nutrition to help support your growing baby.  Eat a variety of foods, especially fruits and vegetables to get a full range of vitamins and minerals.  Practice good food safety and cleanliness. Wash your hands before you eat   and after you prepare raw meat. Wash all fruits and vegetables well before peeling or eating. Taking these actions can help to prevent food-borne illnesses, such as listeriosis, that can be very dangerous to your baby.  Do not eat raw meat or fish. Do not eat fish that have high mercury content, such as tilefish, shark, swordfish, and king mackerel. Do not eat unpasteurized (raw) dairy.  Take a prenatal vitamin to help meet your additional vitamin and mineral needs during pregnancy, specifically for folic acid, iron, calcium, and vitamin D. This information is not intended to replace advice given to you by  your health care provider. Make sure you discuss any questions you have with your health care provider. Document Revised: 03/19/2019 Document Reviewed: 07/26/2017 Elsevier Patient Education  2020 [employer].  

## 2020-08-16 NOTE — Progress Notes (Signed)
    SUBJECTIVE:   CHIEF COMPLAINT / HPI:   Possible Pregnancy This is a new (She had a recent home pregnancy test and wanted to confirm and r/o miscarriage since she had a recent spontaneous abortion. She is uncertain of her LMP) problem. Pertinent negatives include no nausea or vomiting. Associated symptoms comments: Feels well otherwise. She is a bit anxious about the pregnancy. She came in today with her fiancee who showed great support.. Treatments tried: She takes ner PNV daily. No other medication.   BV: She had a recent BV infection which was adequately treated. She denies any vaginal discharge. No other concerns.  PERTINENT  PMH / PSH: PMX reviewed.  OBJECTIVE:   BP 122/78   Pulse 80   Ht 5' 4.5" (1.638 m)   Wt 229 lb (103.9 kg)   LMP 05/12/2020   SpO2 98%   BMI 38.70 kg/m   Physical Exam Vitals and nursing note reviewed.  Cardiovascular:     Rate and Rhythm: Normal rate and regular rhythm.     Heart sounds: Normal heart sounds. No murmur heard.   Pulmonary:     Effort: Pulmonary effort is normal. No respiratory distress.     Breath sounds: Normal breath sounds. No wheezing or rhonchi.  Abdominal:     General: Abdomen is flat. Bowel sounds are normal. There is no distension.     Palpations: Abdomen is soft. There is no mass.     Tenderness: There is no abdominal tenderness.  Musculoskeletal:     Right lower leg: No edema.     Left lower leg: No edema.  Neurological:     Mental Status: She is alert.  Psychiatric:        Mood and Affect: Mood normal.        Behavior: Behavior normal.      Office Visit from 08/16/2020 in [facility]  PHQ-9 Total Score 10       ASSESSMENT/PLAN:   Incidental Pregnancy: A bit anxious about the pregnancy due to her recent miscarriage. I reviewed her previous record for her miscarriage, including her ED visit. She came in with her fiancee, who showed a tremendous amount of support. I reassured her and her  partner since there is no bleeding, the likelihood of abortion is low at this point, even though we can't prevent it if it happens since the baby is at this point is considered non-viable. Since she is uncertain of her LMP, we will obtain OB U/S for dating. OB labs were obtained. I instructed her to schedule a prenatal intake appointment on her way out. F/U soon if cramping becomes frequent or she has vaginal bleeding. Continue prenatal vitamin. She verbalized understanding.  Hx of BV: She denies vaginal discharge. I reassured her, no further testing is needed. She will f/u soon if she notices any discharge.  Janit Pagan, MD [facility]

## 2020-08-18 ENCOUNTER — Telehealth: Payer: Self-pay | Admitting: Family Medicine

## 2020-08-18 LAB — OBSTETRIC PANEL, INCLUDING HIV
Antibody Screen: NEGATIVE
Basophils Absolute: 0 10*3/uL (ref 0.0–0.2)
Basos: 1 %
EOS (ABSOLUTE): 0.1 10*3/uL (ref 0.0–0.4)
Eos: 1 %
HIV Screen 4th Generation wRfx: NONREACTIVE
Hematocrit: 38.3 % (ref 34.0–46.6)
Hemoglobin: 12.3 g/dL (ref 11.1–15.9)
Hepatitis B Surface Ag: NEGATIVE
Immature Grans (Abs): 0 10*3/uL (ref 0.0–0.1)
Immature Granulocytes: 0 %
Lymphocytes Absolute: 2 10*3/uL (ref 0.7–3.1)
Lymphs: 35 %
MCH: 27.2 pg (ref 26.6–33.0)
MCHC: 32.1 g/dL (ref 31.5–35.7)
MCV: 85 fL (ref 79–97)
Monocytes Absolute: 0.3 10*3/uL (ref 0.1–0.9)
Monocytes: 6 %
Neutrophils Absolute: 3.3 10*3/uL (ref 1.4–7.0)
Neutrophils: 57 %
Platelets: 387 10*3/uL (ref 150–450)
RBC: 4.52 x10E6/uL (ref 3.77–5.28)
RDW: 13.8 % (ref 11.7–15.4)
RPR Ser Ql: NONREACTIVE
Rh Factor: POSITIVE
Rubella Antibodies, IGG: 4.08 index (ref >0.99)
WBC: 5.8 10*3/uL (ref 3.4–10.8)

## 2020-08-18 LAB — HGB FRACTIONATION CASCADE
Hgb A2: 2.7 % (ref 1.8–3.2)
Hgb A: 97.3 % (ref 96.4–98.8)
Hgb F: 0 % (ref 0.0–2.0)
Hgb S: 0 %

## 2020-08-18 NOTE — Telephone Encounter (Signed)
Unable to reach her.  Just giving her a visit f/u call.  Staff, if she calls, please help her schedule a prenatal appointment with the resident. Thanks.

## 2020-08-24 ENCOUNTER — Encounter: Payer: Self-pay | Admitting: Family Medicine

## 2020-08-29 ENCOUNTER — Other Ambulatory Visit: Payer: Self-pay

## 2020-08-29 ENCOUNTER — Ambulatory Visit
Admission: RE | Admit: 2020-08-29 | Discharge: 2020-08-29 | Disposition: A | Discharge status: Home / Self Care | Payer: Medicaid Other | Source: Ambulatory Visit | Attending: Family Medicine | Admitting: Family Medicine

## 2020-08-29 DIAGNOSIS — Z3687 Encounter for antenatal screening for uncertain dates: Secondary | ICD-10-CM | POA: Diagnosis not present

## 2020-08-29 DIAGNOSIS — Z331 Pregnant state, incidental: Secondary | ICD-10-CM | POA: Diagnosis not present

## 2020-08-30 ENCOUNTER — Telehealth: Payer: Self-pay | Admitting: Family Medicine

## 2020-08-30 NOTE — Telephone Encounter (Signed)
Will call patient and set up appointment today.

## 2020-08-30 NOTE — Telephone Encounter (Signed)
Called patient twice - phone would ring and both times when it would go to voicemail the phone would disconnect. I was not able to leave vm. Will attempt at another time.

## 2020-08-30 NOTE — Telephone Encounter (Signed)
Will forward to New OB coordinator to make this appointment. Zaraya Delauder,CMA

## 2020-08-30 NOTE — Telephone Encounter (Signed)
OB US report discussed with her. She denies any vaginal bleeding. She feels well otherwise. We discussed the FHR < 100 bpm. As discussed with her an embryonic heart rate less than 100 beats/min detected at 6.1 weeks or less is not necessarily a poor prognostic indicator. However, it is worth keeping an eye on.  I discussed her OB lab work results with her. As discussed with her, I was not aware she had a recent lab during her recent pregnancy about two months ago, hence some of the labs were not need e.g hemoglobin electrophoresis.   She will continue prenatal labs and follow up for prenatal care.

## 2020-09-07 NOTE — Telephone Encounter (Signed)
Called and scheduled patient follow up appointment for tomorrow morning. Patient will schedule new OB visit after appointment tomorrow.   Veronda Prude, RN

## 2020-09-08 ENCOUNTER — Ambulatory Visit: Payer: Medicaid Other

## 2020-09-12 ENCOUNTER — Ambulatory Visit (INDEPENDENT_AMBULATORY_CARE_PROVIDER_SITE_OTHER): Payer: Medicaid Other | Admitting: Family Medicine

## 2020-09-12 ENCOUNTER — Other Ambulatory Visit: Payer: Self-pay

## 2020-09-12 VITALS — BP 118/60 | HR 50 | Ht 64.5 in | Wt 231.0 lb

## 2020-09-12 DIAGNOSIS — N3000 Acute cystitis without hematuria: Secondary | ICD-10-CM

## 2020-09-12 DIAGNOSIS — R300 Dysuria: Secondary | ICD-10-CM | POA: Diagnosis not present

## 2020-09-12 LAB — POCT URINALYSIS DIP (MANUAL ENTRY)
Bilirubin, UA: NEGATIVE
Glucose, UA: NEGATIVE mg/dL
Ketones, POC UA: NEGATIVE mg/dL
Nitrite, UA: NEGATIVE
Protein Ur, POC: 30 mg/dL — AB
Spec Grav, UA: 1.025 (ref 1.010–1.025)
Urobilinogen, UA: 0.2 E.U./dL
pH, UA: 7.5 (ref 5.0–8.0)

## 2020-09-12 MED ORDER — CEPHALEXIN 500 MG PO CAPS: 500.0000 mg | ORAL_CAPSULE | Freq: Four times a day (QID) | ORAL | 0 refills | Status: AC

## 2020-09-12 NOTE — Patient Instructions (Addendum)
Thank you for coming to see me today. It was a pleasure. Today we talked about:   I have sent a prescription for an antibiotic for you to take for the next 7 days, take 4 times daily.  We will also send your urine off for culture.  Please follow-up in the next 1-2 weeks for an initial pregnancy visit.  If you have any questions or concerns, please do not hesitate to call the office at [phone].  Best,   Luis Abed, DO

## 2020-09-12 NOTE — Progress Notes (Signed)
° ° °  SUBJECTIVE:   CHIEF COMPLAINT / HPI:   DYSURIA Patient is currently pregnant, around 8 weeks Since 10/25, she is having burnign with urination, weighing down of her bladder when she pees No blood in urine Any antibiotics in the last 30 days: no More than 3 UTIs in the last 12 months: no STD exposure: none  Symptoms Urgency: yes Frequency: yes Blood in urine: no Pain in back:no Fever: no Vaginal discharge: no No pelvic pain or vaginal bleeding.  PERTINENT  PMH / PSH: Hx miscarriage  OBJECTIVE:   BP 118/60    Pulse (!) 50    Ht 5' 4.5" (1.638 m)    Wt 231 lb (104.8 kg)    SpO2 98%    BMI 39.04 kg/m    Physical Exam:  General: 19 y.o. female in NAD Lungs: Breathing comfortably on room air Abdomen: Soft, non-tender to palpation, mild auprapubic discomfort Skin: warm and dry Extremities: No edema, ambulating without difficulty   Results for orders placed or performed in visit on 09/12/20 (from the past 24 hour(s))  POCT urinalysis dipstick     Status: Abnormal   Collection Time: 09/12/20  9:30 AM  Result Value Ref Range   Color, UA yellow yellow   Clarity, UA cloudy (A) clear   Glucose, UA negative negative mg/dL   Bilirubin, UA negative negative   Ketones, POC UA negative negative mg/dL   Spec Grav, UA 5.956 3.875 - 1.025   Blood, UA trace-intact (A) negative   pH, UA 7.5 5.0 - 8.0   Protein Ur, POC =30 (A) negative mg/dL   Urobilinogen, UA 0.2 0.2 or 1.0 E.U./dL   Nitrite, UA Negative Negative   Leukocytes, UA Trace (A) Negative     ASSESSMENT/PLAN:   Acute cystitis without hematuria UA is consistent with UTI.  We will go ahead and send off for OB culture given that she is newly pregnant.  We will also order Keflex 500 mg 4 times daily for 7 days.  She will need a follow-up culture for test of cure.  She was advised to make an appointment in 1 to 2 weeks for her initial OB visit.   She has already had an early ultrasound.  She was advised to follow-up  for her new OB visit.  Unknown Jim, DO [facility]

## 2020-09-12 NOTE — Assessment & Plan Note (Signed)
UA is consistent with UTI.  We will go ahead and send off for OB culture given that she is newly pregnant.  We will also order Keflex 500 mg 4 times daily for 7 days.  She will need a follow-up culture for test of cure.  She was advised to make an appointment in 1 to 2 weeks for her initial OB visit.

## 2020-09-15 LAB — URINE CULTURE, OB REFLEX

## 2020-09-15 LAB — CULTURE, OB URINE

## 2020-09-20 ENCOUNTER — Encounter: Payer: Self-pay | Admitting: Family Medicine

## 2020-09-23 ENCOUNTER — Inpatient Hospital Stay (HOSPITAL_COMMUNITY)
Admission: EM | Admit: 2020-09-23 | Discharge: 2020-09-23 | Disposition: A | Discharge status: Home / Self Care | Payer: Medicaid Other | Attending: Obstetrics and Gynecology | Admitting: Obstetrics and Gynecology

## 2020-09-23 ENCOUNTER — Other Ambulatory Visit: Payer: Self-pay

## 2020-09-23 ENCOUNTER — Encounter (HOSPITAL_COMMUNITY): Payer: Self-pay | Admitting: Emergency Medicine

## 2020-09-23 DIAGNOSIS — Z3A10 10 weeks gestation of pregnancy: Secondary | ICD-10-CM | POA: Diagnosis not present

## 2020-09-23 DIAGNOSIS — Z3687 Encounter for antenatal screening for uncertain dates: Secondary | ICD-10-CM | POA: Diagnosis not present

## 2020-09-23 DIAGNOSIS — O219 Vomiting of pregnancy, unspecified: Secondary | ICD-10-CM | POA: Diagnosis present

## 2020-09-23 DIAGNOSIS — E860 Dehydration: Secondary | ICD-10-CM | POA: Diagnosis not present

## 2020-09-23 DIAGNOSIS — O99281 Endocrine, nutritional and metabolic diseases complicating pregnancy, first trimester: Secondary | ICD-10-CM | POA: Diagnosis not present

## 2020-09-23 LAB — URINALYSIS, ROUTINE W REFLEX MICROSCOPIC
Bilirubin Urine: NEGATIVE
Glucose, UA: NEGATIVE mg/dL
Hgb urine dipstick: NEGATIVE
Ketones, ur: 80 mg/dL — AB
Leukocytes,Ua: NEGATIVE
Nitrite: NEGATIVE
Protein, ur: NEGATIVE mg/dL
Specific Gravity, Urine: 1.019 (ref 1.005–1.030)
pH: 6 (ref 5.0–8.0)

## 2020-09-23 LAB — CBC
HCT: 39.4 % (ref 36.0–46.0)
Hemoglobin: 13 g/dL (ref 12.0–15.0)
MCH: 28.3 pg (ref 26.0–34.0)
MCHC: 33 g/dL (ref 30.0–36.0)
MCV: 85.7 fL (ref 80.0–100.0)
Platelets: 342 10*3/uL (ref 150–400)
RBC: 4.6 MIL/uL (ref 3.87–5.11)
RDW: 13.8 % (ref 11.5–15.5)
WBC: 6.6 10*3/uL (ref 4.0–10.5)
nRBC: 0 % (ref 0.0–0.2)

## 2020-09-23 LAB — COMPREHENSIVE METABOLIC PANEL
ALT: 31 U/L (ref 0–44)
AST: 22 U/L (ref 15–41)
Albumin: 3.9 g/dL (ref 3.5–5.0)
Alkaline Phosphatase: 66 U/L (ref 38–126)
Anion gap: 10 (ref 5–15)
BUN: 5 mg/dL — ABNORMAL LOW (ref 6–20)
CO2: 24 mmol/L (ref 22–32)
Calcium: 9.9 mg/dL (ref 8.9–10.3)
Chloride: 100 mmol/L (ref 98–111)
Creatinine, Ser: 0.61 mg/dL (ref 0.44–1.00)
GFR, Estimated: >60 mL/min (ref >60)
Glucose, Bld: 89 mg/dL (ref 70–99)
Potassium: 3.6 mmol/L (ref 3.5–5.1)
Sodium: 134 mmol/L — ABNORMAL LOW (ref 135–145)
Total Bilirubin: 0.6 mg/dL (ref 0.3–1.2)
Total Protein: 8 g/dL (ref 6.5–8.1)

## 2020-09-23 MED ORDER — FAMOTIDINE 20 MG PO TABS: 20.0000 mg | ORAL_TABLET | Freq: Every day | ORAL | 1 refills | Status: DC

## 2020-09-23 MED ORDER — ONDANSETRON 4 MG PO TBDP: 4.0000 mg | ORAL_TABLET | Freq: Three times a day (TID) | ORAL | 1 refills | Status: DC | PRN

## 2020-09-23 MED ORDER — LACTATED RINGERS IV BOLUS
1000.0000 mL | Freq: Once | INTRAVENOUS | Status: AC
  Administered 2020-09-23: 1000 mL via INTRAVENOUS

## 2020-09-23 MED ORDER — LACTATED RINGERS IV BOLUS: 1000.0000 mL | Freq: Once | INTRAVENOUS | Status: AC

## 2020-09-23 MED ORDER — SODIUM CHLORIDE 0.9 % IV SOLN
8.0000 mg | Freq: Once | INTRAVENOUS | Status: AC
  Administered 2020-09-23: 8 mg via INTRAVENOUS
  Filled 2020-09-23: qty 4

## 2020-09-23 NOTE — MAU Provider Note (Signed)
History     CSN: [id number]  Arrival date and time: 09/23/20 1657   First Provider Initiated Contact with Patient 09/23/20 2037      Chief Complaint  Patient presents with  . Dehydration  . Dizziness  . Emesis  . Nausea   Ms. Jacqueline Yu is a 19 y.o. G2P0010 at [redacted]w[redacted]d who presents to MAU for nausea and vomiting. Pt has confirmed IUP on [place] 08/29/2020.  Onset: 3 weeks Location: stomach Duration: 3 weeks Character: constant nausea, vomiting x2 daily, dizziness that started last night Aggravating/Associated: smells/none Relieving: lying down (sometimes) Treatment: ginger - did not work  Pt denies VB, vaginal discharge/odor/itching. Pt denies abdominal pain, constipation, diarrhea, or urinary problems. Pt denies fever, chills, fatigue, sweating or changes in appetite. Pt denies SOB or chest pain. Pt denies HA, light-headedness, weakness.  Problems this pregnancy include: pt has not yet been. Allergies? peanuts Current medications/supplements? Keflex (pt reports she was prescribed this for a UTI, but stopped taking it 2 days ago, pt encouraged to restart) Prenatal care provider? Pt requests list of OB Providers   OB History    Gravida  2   Para  0   Term  0   Preterm  0   AB  1   Living  0     SAB  1   TAB      Ectopic      Multiple      Live Births              Past Medical History:  Diagnosis Date  . Medical history non-contributory   . Obesity     Past Surgical History:  Procedure Laterality Date  . NO PAST SURGERIES      Family History  Problem Relation Age of Onset  . Hypertension Mother   . Hyperlipidemia Mother   . Migraines Mother   . Anxiety disorder Mother   . Schizophrenia Brother   . Depression Brother   . Anxiety disorder Brother   . Schizophrenia Maternal Grandmother   . Depression Maternal Grandmother   . Anxiety disorder Maternal Grandmother     Social History   Tobacco Use  . Smoking status: Never Smoker  .  Smokeless tobacco: Never Used  Vaping Use  . Vaping Use: Never used  Substance Use Topics  . Alcohol use: Not Currently  . Drug use: Not Currently    Types: Marijuana    Allergies:  Allergies  Allergen Reactions  . Peanuts [Peanut Oil]     Scratchy throat    Medications Prior to Admission  Medication Sig Dispense Refill Last Dose  . cephALEXin (KEFLEX) 500 MG capsule Take 500 mg by mouth 4 (four) times daily. 2 days remaining     . Prenatal Vit-Fe Fumarate-FA (MULTIVITAMIN-PRENATAL) 27-0.8 MG TABS tablet Take 1 tablet by mouth daily at 12 noon.       Review of Systems  Constitutional: Negative for chills, diaphoresis, fatigue and fever.  Eyes: Negative for visual disturbance.  Respiratory: Negative for shortness of breath.   Cardiovascular: Negative for chest pain.  Gastrointestinal: Positive for nausea and vomiting. Negative for abdominal pain, constipation and diarrhea.  Genitourinary: Negative for dysuria, flank pain, frequency, pelvic pain, urgency, vaginal bleeding and vaginal discharge.  Neurological: Negative for dizziness, weakness, light-headedness and headaches.   Physical Exam   Blood pressure (!) 109/53, pulse 71, temperature 98.6 F (37 C), temperature source Oral, resp. rate 17, height 5' 4.5" (1.638 m), weight 101 kg, last menstrual  period 07/15/2020, SpO2 99 %.  Patient Vitals for the past 24 hrs:  BP Temp Temp src Pulse Resp SpO2 Height Weight  09/23/20 2037 (!) 109/53 98.6 F (37 C) Oral 71 17 -- -- --  09/23/20 1804 126/72 -- -- 79 16 99 % 5' 4.5" (1.638 m) 101 kg  09/23/20 1711 -- -- -- -- -- -- 5' 4.5" (1.638 m) 105 kg  09/23/20 1707 131/69 98.7 F (37.1 C) Oral 83 17 99 % -- --   Physical Exam Vitals and nursing note reviewed.  Constitutional:      General: She is not in acute distress.    Appearance: Normal appearance. She is not ill-appearing, toxic-appearing or diaphoretic.  HENT:     Head: Normocephalic and atraumatic.  Pulmonary:      Effort: Pulmonary effort is normal.  Neurological:     Mental Status: She is alert and oriented to person, place, and time.  Psychiatric:        Mood and Affect: Mood normal.        Behavior: Behavior normal.        Thought Content: Thought content normal.        Judgment: Judgment normal.    Results for orders placed or performed during the hospital encounter of 09/23/20 (from the past 24 hour(s))  Urinalysis, Routine w reflex microscopic Urine, Clean Catch     Status: Abnormal   Collection Time: 09/23/20  6:15 PM  Result Value Ref Range   Color, Urine YELLOW YELLOW   APPearance CLEAR CLEAR   Specific Gravity, Urine 1.019 1.005 - 1.030   pH 6.0 5.0 - 8.0   Glucose, UA NEGATIVE NEGATIVE mg/dL   Hgb urine dipstick NEGATIVE NEGATIVE   Bilirubin Urine NEGATIVE NEGATIVE   Ketones, ur 80 (A) NEGATIVE mg/dL   Protein, ur NEGATIVE NEGATIVE mg/dL   Nitrite NEGATIVE NEGATIVE   Leukocytes,Ua NEGATIVE NEGATIVE  CBC     Status: None   Collection Time: 09/23/20  8:11 PM  Result Value Ref Range   WBC 6.6 4.0 - 10.5 K/uL   RBC 4.60 3.87 - 5.11 MIL/uL   Hemoglobin 13.0 12.0 - 15.0 g/dL   HCT 95.1 36 - 46 %   MCV 85.7 80.0 - 100.0 fL   MCH 28.3 26.0 - 34.0 pg   MCHC 33.0 30.0 - 36.0 g/dL   RDW 88.4 16.6 - 06.3 %   Platelets 342 150 - 400 K/uL   nRBC 0.0 0.0 - 0.2 %  Comprehensive metabolic panel     Status: Abnormal   Collection Time: 09/23/20  8:11 PM  Result Value Ref Range   Sodium 134 (L) 135 - 145 mmol/L   Potassium 3.6 3.5 - 5.1 mmol/L   Chloride 100 98 - 111 mmol/L   CO2 24 22 - 32 mmol/L   Glucose, Bld 89 70 - 99 mg/dL   BUN 5 (L) 6 - 20 mg/dL   Creatinine, Ser 0.16 0.44 - 1.00 mg/dL   Calcium 9.9 8.9 - 01.0 mg/dL   Total Protein 8.0 6.5 - 8.1 g/dL   Albumin 3.9 3.5 - 5.0 g/dL   AST 22 15 - 41 U/L   ALT 31 0 - 44 U/L   Alkaline Phosphatase 66 38 - 126 U/L   Total Bilirubin 0.6 0.3 - 1.2 mg/dL   GFR, Estimated >93 >23 mL/min   Anion gap 10 5 - 15   US OB LESS THAN 14  WEEKS WITH OB TRANSVAGINAL  Result Date: 08/29/2020  CLINICAL DATA:  Positive pregnancy test, dating EXAM: OBSTETRIC <14 WK US AND TRANSVAGINAL OB US TECHNIQUE: Both transabdominal and transvaginal ultrasound examinations were performed for complete evaluation of the gestation as well as the maternal uterus, adnexal regions, and pelvic cul-de-sac. Transvaginal technique was performed to assess early pregnancy. COMPARISON:  None for this gestation FINDINGS: Intrauterine gestational sac: Present, single Yolk sac:  Present Embryo:  Present Cardiac Activity: Present Heart Rate: 85 bpm MSD: 12.9 mm   6 w   1 d CRL: 2.0 mm w d US EDC: below range of chart Subchorionic hemorrhage:  None visualized. Maternal uterus/adnexae: RIGHT ovary normal size and morphology, 2.9 x 1.8 x 1.1 cm. LEFT ovary measures 3.4 x 2.1 x 2.4 cm and contains a corpus luteum. Small amount of nonspecific free pelvic fluid. No adnexal masses. IMPRESSION: Single live early intrauterine gestation as above. Electronically Signed   By: Ulyses SouthwardMark  Boles M.D.   On: 08/29/2020 17:20    MAU Course  Procedures  MDM -N/V in pregnancy -weight today 101kg -UA: 80 ketones -CBC: WNL -CMP: WNL -1L LR + Zofran 8MG  given, pt reports NV now resolved -Pt informed that the ultrasound is considered a limited OB ultrasound and is not intended to be a complete ultrasound exam.  Patient also informed that the ultrasound is not being completed with the intent of assessing for fetal or placental anomalies or any pelvic abnormalities.  Explained that the purpose of today's ultrasound is to assess for viability (FHR 179).  Patient acknowledges the purpose of the exam and the limitations of the study.   -US performed as FHR was 85 on initial scan and FHT unable to be heard with Doppler today -pt able to urinate after fluids  -PO challenge successful -pt discharged to home in stable condition  Orders Placed This Encounter  Procedures  . Culture, OB Urine     Standing Status:   Standing    Number of Occurrences:   1  . Urinalysis, Routine w reflex microscopic Urine, Clean Catch    Standing Status:   Standing    Number of Occurrences:   1  . CBC    Standing Status:   Standing    Number of Occurrences:   1  . Comprehensive metabolic panel    Standing Status:   Standing    Number of Occurrences:   1  . Orthostatic vital signs    Standing Status:   Standing    Number of Occurrences:   1  . Discharge patient    Order Specific Question:   Discharge disposition    Answer:   01-Home or Self Care [1]    Order Specific Question:   Discharge patient date    Answer:   09/23/2020   Meds ordered this encounter  Medications  . lactated ringers bolus 1,000 mL  . ondansetron (ZOFRAN) 8 mg in sodium chloride 0.9 % 50 mL IVPB  . lactated ringers bolus 1,000 mL  . ondansetron (ZOFRAN ODT) 4 MG disintegrating tablet    Sig: Take 1 tablet (4 mg total) by mouth every 8 (eight) hours as needed for nausea or vomiting.    Dispense:  30 tablet    Refill:  1    Order Specific Question:   Supervising Provider    Answer:   ERVIN, MICHAEL L [[id number]]  . famotidine (PEPCID) 20 MG tablet    Sig: Take 1 tablet (20 mg total) by mouth daily.    Dispense:  30 tablet  Refill:  1    Order Specific Question:   Supervising Provider    Answer:   Alysia Penna, MICHAEL L [[id number]]    Assessment and Plan   1. Nausea and vomiting in pregnancy   2. [redacted] weeks gestation of pregnancy     Allergies as of 09/23/2020      Reactions   Peanuts [peanut Oil]    Scratchy throat      Medication List    TAKE these medications   cephALEXin 500 MG capsule Commonly known as: KEFLEX Take 500 mg by mouth 4 (four) times daily. 2 days remaining   famotidine 20 MG tablet Commonly known as: Pepcid Take 1 tablet (20 mg total) by mouth daily.   multivitamin-prenatal 27-0.8 MG Tabs tablet Take 1 tablet by mouth daily at 12 noon.   ondansetron 4 MG disintegrating tablet Commonly known as:  Zofran ODT Take 1 tablet (4 mg total) by mouth every 8 (eight) hours as needed for nausea or vomiting.       -will call with culture results, if positive -RX Zofran -RX Pepcid -safe meds in pregnancy list given -OB Providers list given -pt advised to take medications around the clock and not to stop taking if feeling better -discussed nonpharmacologic and pharmacologic treatments of N/V -discussed normal expectations for N/V in pregnancy -pt discharged to home in stable condition  Jacqueline Yu 09/23/2020, 9:48 PM

## 2020-09-23 NOTE — MAU Note (Signed)
Tolerating water and cracker without nausea and vomiting. Pt up to bathroom to urinate

## 2020-09-23 NOTE — Discharge Instructions (Signed)
Morning Sickness  Morning sickness is when a woman feels nauseous during pregnancy. This nauseous feeling may or may not come with vomiting. It often occurs in the morning, but it can be a problem at any time of day. Morning sickness is most common during the first trimester. In some cases, it may continue throughout pregnancy. Although morning sickness is unpleasant, it is usually harmless unless the woman develops severe and continual vomiting (hyperemesis gravidarum), a condition that requires more intense treatment. What are the causes? The exact cause of this condition is not known, but it seems to be related to normal hormonal changes that occur in pregnancy. What increases the risk? You are more likely to develop this condition if:  You experienced nausea or vomiting before your pregnancy.  You had morning sickness during a previous pregnancy.  You are pregnant with more than one baby, such as twins. What are the signs or symptoms? Symptoms of this condition include:  Nausea.  Vomiting. How is this diagnosed? This condition is usually diagnosed based on your signs and symptoms. How is this treated? In many cases, treatment is not needed for this condition. Making some changes to what you eat may help to control symptoms. Your health care provider may also prescribe or recommend:  Vitamin B6 supplements.  Anti-nausea medicines.  Ginger. Follow these instructions at home: Medicines  Take over-the-counter and prescription medicines only as told by your health care provider. Do not use any prescription, over-the-counter, or herbal medicines for morning sickness without first talking with your health care provider.  Taking multivitamins before getting pregnant can prevent or decrease the severity of morning sickness in most women. Eating and drinking  Eat a piece of dry toast or crackers before getting out of bed in the morning.  Eat 5 or 6 small meals a day.  Eat dry and  bland foods, such as rice or a baked potato. Foods that are high in carbohydrates are often helpful.  Avoid greasy, fatty, and spicy foods.  Have someone cook for you if the smell of any food causes nausea and vomiting.  If you feel nauseous after taking prenatal vitamins, take the vitamins at night or with a snack.  Snack on protein foods between meals if you are hungry. Nuts, yogurt, and cheese are good options.  Drink fluids throughout the day.  Try ginger ale made with real ginger, ginger tea made from fresh grated ginger, or ginger candies. General instructions  Do not use any products that contain nicotine or tobacco, such as cigarettes and e-cigarettes. If you need help quitting, ask your health care provider.  Get an air purifier to keep the air in your house free of odors.  Get plenty of fresh air.  Try to avoid odors that trigger your nausea.  Consider trying these methods to help relieve symptoms: ? Wearing an acupressure wristband. These wristbands are often worn for seasickness. ? Acupuncture. Contact a health care provider if:  Your home remedies are not working and you need medicine.  You feel dizzy or light-headed.  You are losing weight. Get help right away if:  You have persistent and uncontrolled nausea and vomiting.  You faint.  You have severe pain in your abdomen. Summary  Morning sickness is when a woman feels nauseous during pregnancy. This nauseous feeling may or may not come with vomiting.  Morning sickness is most common during the first trimester.  It often occurs in the morning, but it can be a problem at  any time of day.  In many cases, treatment is not needed for this condition. Making some changes to what you eat may help to control symptoms. This information is not intended to replace advice given to you by your health care provider. Make sure you discuss any questions you have with your health care provider. Document Revised:  10/11/2017 Document Reviewed: 12/01/2016 Elsevier Patient Education  2020 Elsevier Inc.        Hyperemesis Gravidarum Hyperemesis gravidarum is a severe form of nausea and vomiting that happens during pregnancy. Hyperemesis is worse than morning sickness. It may cause you to have nausea or vomiting all day for many days. It may keep you from eating and drinking enough food and liquids, which can lead to dehydration, malnutrition, and weight loss. Hyperemesis usually occurs during the first half (the first 20 weeks) of pregnancy. It often goes away once a woman is in her second half of pregnancy. However, sometimes hyperemesis continues through an entire pregnancy. What are the causes? The cause of this condition is not known. It may be related to changes in chemicals (hormones) in the body during pregnancy, such as the high level of pregnancy hormone (human chorionic gonadotropin) or the increase in the female sex hormone (estrogen). What are the signs or symptoms? Symptoms of this condition include:  Nausea that does not go away.  Vomiting that does not allow you to keep any food down.  Weight loss.  Body fluid loss (dehydration).  Having no desire to eat, or not liking food that you have previously enjoyed. How is this diagnosed? This condition may be diagnosed based on:  A physical exam.  Your medical history.  Your symptoms.  Blood tests.  Urine tests. How is this treated? This condition is managed by controlling symptoms. This may include:  Following an eating plan. This can help lessen nausea and vomiting.  Taking prescription medicines. An eating plan and medicines are often used together to help control symptoms. If medicines do not help relieve nausea and vomiting, you may need to receive fluids through an IV at the hospital. Follow these instructions at home: Eating and drinking   Avoid the following: ? Drinking fluids with meals. Try not to drink anything  during the 30 minutes before and after your meals. ? Drinking more than 1 cup of fluid at a time. ? Eating foods that trigger your symptoms. These may include spicy foods, coffee, high-fat foods, very sweet foods, and acidic foods. ? Skipping meals. Nausea can be more intense on an empty stomach. If you cannot tolerate food, do not force it. Try sucking on ice chips or other frozen items and make up for missed calories later. ? Lying down within 2 hours after eating. ? Being exposed to environmental triggers. These may include food smells, smoky rooms, closed spaces, rooms with strong smells, warm or humid places, overly loud and noisy rooms, and rooms with motion or flickering lights. Try eating meals in a well-ventilated area that is free of strong smells. ? Quick and sudden changes in your movement. ? Taking iron pills and multivitamins that contain iron. If you take prescription iron pills, do not stop taking them unless your health care provider approves. ? Preparing food. The smell of food can spoil your appetite or trigger nausea.  To help relieve your symptoms: ? Listen to your body. Everyone is different and has different preferences. Find what works best for you. ? Eat and drink slowly. ? Eat 5-6 small meals   daily instead of 3 large meals. Eating small meals and snacks can help you avoid an empty stomach. ? In the morning, before getting out of bed, eat a couple of crackers to avoid moving around on an empty stomach. ? Try eating starchy foods as these are usually tolerated well. Examples include cereal, toast, bread, potatoes, pasta, rice, and pretzels. ? Include at least 1 serving of protein with your meals and snacks. Protein options include lean meats, poultry, seafood, beans, nuts, nut butters, eggs, cheese, and yogurt. ? Try eating a protein-rich snack before bed. Examples of a protein-rick snack include cheese and crackers or a peanut butter sandwich made with 1 slice of whole-wheat  bread and 1 tsp (5 g) of peanut butter. ? Eat or suck on things that have ginger in them. It may help relieve nausea. Add  tsp ground ginger to hot tea or choose ginger tea. ? Try drinking 100% fruit juice or an electrolyte drink. An electrolyte drink contains sodium, potassium, and chloride. ? Drink fluids that are cold, clear, and carbonated or sour. Examples include lemonade, ginger ale, lemon-lime soda, ice water, and sparkling water. ? Brush your teeth or use a mouth rinse after meals. ? Talk with your health care provider about starting a supplement of vitamin B6. General instructions  Take over-the-counter and prescription medicines only as told by your health care provider.  Follow instructions from your health care provider about eating or drinking restrictions.  Continue to take your prenatal vitamins as told by your health care provider. If you are having trouble taking your prenatal vitamins, talk with your health care provider about different options.  Keep all follow-up and pre-birth (prenatal) visits as told by your health care provider. This is important. Contact a health care provider if:  You have pain in your abdomen.  You have a severe headache.  You have vision problems.  You are losing weight.  You feel weak or dizzy. Get help right away if:  You cannot drink fluids without vomiting.  You vomit blood.  You have constant nausea and vomiting.  You are very weak.  You faint.  You have a fever and your symptoms suddenly get worse. Summary  Hyperemesis gravidarum is a severe form of nausea and vomiting that happens during pregnancy.  Making some changes to your eating habits may help relieve nausea and vomiting.  This condition may be managed with medicine.  If medicines do not help relieve nausea and vomiting, you may need to receive fluids through an IV at the hospital. This information is not intended to replace advice given to you by your health  care provider. Make sure you discuss any questions you have with your health care provider. Document Revised: 11/18/2017 Document Reviewed: 06/27/2016 Elsevier Patient Education  2020 Elsevier Inc.                        Safe Medications in Pregnancy    Acne: Benzoyl Peroxide Salicylic Acid  Backache/Headache: Tylenol: 2 regular strength every 4 hours OR              2 Extra strength every 6 hours  Colds/Coughs/Allergies: Benadryl (alcohol free) 25 mg every 6 hours as needed Breath right strips Claritin Cepacol throat lozenges Chloraseptic throat spray Cold-Eeze- up to three times per day Cough drops, alcohol free Flonase (by prescription only) Guaifenesin Mucinex Robitussin DM (plain only, alcohol free) Saline nasal spray/drops Sudafed (pseudoephedrine) & Actifed ** use only after   [redacted] weeks gestation and if you do not have high blood pressure Tylenol Vicks Vaporub Zinc lozenges Zyrtec   Constipation: Colace Ducolax suppositories Fleet enema Glycerin suppositories Metamucil Milk of magnesia Miralax Senokot Smooth move tea  Diarrhea: Kaopectate Imodium A-D  *NO pepto Bismol  Hemorrhoids: Anusol Anusol HC Preparation H Tucks  Indigestion: Tums Maalox Mylanta Zantac  Pepcid  Insomnia: Benadryl (alcohol free) 25mg every 6 hours as needed Tylenol PM Unisom, no Gelcaps  Leg Cramps: Tums MagGel  Nausea/Vomiting:  Bonine Dramamine Emetrol Ginger extract Sea bands Meclizine  Nausea medication to take during pregnancy:  Unisom (doxylamine succinate 25 mg tablets) Take one tablet daily at bedtime. If symptoms are not adequately controlled, the dose can be increased to a maximum recommended dose of two tablets daily (1/2 tablet in the morning, 1/2 tablet mid-afternoon and one at bedtime). Vitamin B6 100mg tablets. Take one tablet twice a day (up to 200 mg per day).  Skin Rashes: Aveeno products Benadryl cream or 25mg every 6 hours as  needed Calamine Lotion 1% cortisone cream  Yeast infection: Gyne-lotrimin 7 Monistat 7   **If taking multiple medications, please check labels to avoid duplicating the same active ingredients **take medication as directed on the label ** Do not exceed 4000 mg of tylenol in 24 hours **Do not take medications that contain aspirin or ibuprofen          Prenatal Care Providers           [facility] - accepts patients without insurance  Phone: [phone]  [facility] @ [facility]   Phone: [phone]  [facility] @[place]       Phone: [phone]            [facility] @ [place]     Phone: [phone]          [facility] @ [place]   Phone: [phone]  [facility] @ [facility] - accepts patients without insurance  Phone: [phone]  [facility] @ [facility] Phone: [phone]     [facility] - accepts patients without insurance Phone: [phone]  [facility]  Phone: [phone]  [facility] Phone: [phone]  [facility] Phone: [phone]  [facility] Phone: [phone]  [facility] Phone: [phone]  [facility]  Phone: [phone]   

## 2020-09-23 NOTE — ED Triage Notes (Signed)
Pt endorses n/v with her pregnancy. Unable to keep food and fluids down.

## 2020-09-23 NOTE — ED Triage Notes (Signed)
Emergency Medicine Provider OB Triage Evaluation Note  Jacqueline Yu is a 19 y.o. female, G2P0010, at [redacted]w[redacted]d gestation who presents to the emergency department with complaints of Nausea and vomiting with dizziness.  Review of  Systems  Positive: nausea, vomiting, dizziness when standing Negative: fevers, vaginal spotting or bleeding, vaginal loss of fluids, abdominal/pelvic pain  Physical Exam  BP 131/69   Pulse 83   Temp 98.7 F (37.1 C) (Oral)   Resp 17   Ht 5' 4.5" (1.638 m)   Wt 105 kg   LMP 05/12/2020   SpO2 99%   BMI 39.12 kg/m  General: Awake, no distress  HEENT: Atraumatic  Resp: Normal effort  Cardiac: Normal rate Abd: Nondistended, nontender  MSK: Moves all extremities without difficulty Neuro: Speech clear  Medical Decision Making  Pt evaluated for pregnancy concern and is stable for transfer to MAU. Pt is in agreement with plan for transfer.  5:20 PM Discussed with MAU APP, Joni Reining, who accepts patient in transfer.  Clinical Impression   1. Nausea and vomiting in pregnancy   2. [redacted] weeks gestation of pregnancy        Cristina Gong, Cordelia Poche 09/23/20 2201

## 2020-09-23 NOTE — MAU Note (Addendum)
Transfer from ER. been feeling light headed, started yesterday.   Been throwing up a lot since she was 6wks.  Not taking anything for nausea.  Getting worse. No pain. No bleeding.

## 2020-09-25 LAB — CULTURE, OB URINE

## 2020-09-30 ENCOUNTER — Other Ambulatory Visit: Payer: Self-pay

## 2020-09-30 ENCOUNTER — Ambulatory Visit: Payer: Medicaid Other | Admitting: Student in an Organized Health Care Education/Training Program

## 2020-09-30 ENCOUNTER — Other Ambulatory Visit (HOSPITAL_COMMUNITY)
Admission: RE | Admit: 2020-09-30 | Discharge: 2020-09-30 | Disposition: A | Discharge status: Home / Self Care | Payer: Medicaid Other | Source: Ambulatory Visit | Attending: Family Medicine | Admitting: Family Medicine

## 2020-09-30 ENCOUNTER — Encounter: Payer: Medicaid Other | Admitting: Family Medicine

## 2020-09-30 VITALS — BP 115/60 | HR 99 | Wt 224.6 lb

## 2020-09-30 DIAGNOSIS — Z3401 Encounter for supervision of normal first pregnancy, first trimester: Secondary | ICD-10-CM

## 2020-09-30 LAB — OB RESULTS CONSOLE GC/CHLAMYDIA: Gonorrhea: NEGATIVE

## 2020-09-30 NOTE — Patient Instructions (Addendum)
It was a pleasure to see you today!  To summarize our discussion for this visit:  We went over the basics of your early pregnancy.   Your next visit will be in 4 weeks unless you need anything sooner.   you got your first pap smear today as well as routine STD screening. If anything is abnormal, we will contact you.   Some additional health maintenance measures we should update are: Health Maintenance Due  Topic Date Due  . COVID-19 Vaccine (1) Never done  . PAP-Cervical Cytology Screening  Never done  . PAP SMEAR-Modifier  Never done  .   Call the clinic at [phone] if your symptoms worsen or you have any concerns.   Thank you for allowing me to take part in your care,  Dr. Jamelle Rushing   Morning Sickness  Morning sickness is when you feel sick to your stomach (nauseous) during pregnancy. You may feel sick to your stomach and throw up (vomit). You may feel sick in the morning, but you can feel this way at any time of day. Some women feel very sick to their stomach and cannot stop throwing up (hyperemesis gravidarum). Follow these instructions at home: Medicines  Take over-the-counter and prescription medicines only as told by your doctor. Do not take any medicines until you talk with your doctor about them first.  Taking multivitamins before getting pregnant can stop or lessen the harshness of morning sickness. Eating and drinking  Eat dry toast or crackers before getting out of bed.  Eat 5 or 6 small meals a day.  Eat dry and bland foods like rice and baked potatoes.  Do not eat greasy, fatty, or spicy foods.  Have someone cook for you if the smell of food causes you to feel sick or throw up.  If you feel sick to your stomach after taking prenatal vitamins, take them at night or with a snack.  Eat protein when you need a snack. Nuts, yogurt, and cheese are good choices.  Drink fluids throughout the day.  Try ginger ale made with real ginger, ginger tea  made from fresh grated ginger, or ginger candies. General instructions  Do not use any products that have nicotine or tobacco in them, such as cigarettes and e-cigarettes. If you need help quitting, ask your doctor.  Use an air purifier to keep the air in your house free of smells.  Get lots of fresh air.  Try to avoid smells that make you feel sick.  Try: ? Wearing a bracelet that is used for seasickness (acupressure wristband). ? Going to a doctor who puts thin needles into certain body points (acupuncture) to improve how you feel. Contact a doctor if:  You need medicine to feel better.  You feel dizzy or light-headed.  You are losing weight. Get help right away if:  You feel very sick to your stomach and cannot stop throwing up.  You pass out (faint).  You have very bad pain in your belly. Summary  Morning sickness is when you feel sick to your stomach (nauseous) during pregnancy.  You may feel sick in the morning, but you can feel this way at any time of day.  Making some changes to what you eat may help your symptoms go away. This information is not intended to replace advice given to you by your health care provider. Make sure you discuss any questions you have with your health care provider. Document Revised: 10/11/2017 Document Reviewed: 11/29/2016 Elsevier Patient  Education  [employer].

## 2020-09-30 NOTE — Progress Notes (Signed)
Patient Name: Jacqueline Yu Date of Birth: Feb 24, 2001 [facility] Initial Prenatal Visit  Jacqueline Yu is a 19 y.o. year old G2P0010 at [redacted]w[redacted]d who presents for her initial prenatal visit. Pregnancy is planned She reports breast tenderness, morning sickness and nausea. She is taking a prenatal vitamin.  She denies pelvic pain or vaginal bleeding. No urinary symptoms  Pregnancy Dating: . The patient is dated by LMP and early [place].  Marland Kitchen LMP: Sept 3. 2021.  Marland Kitchen Period is certain:  Yes.  . Periods were regular:  Yes.  Marland Kitchen LMP was a typical period:  Yes.  . Using hormonal contraception in 3 months prior to conception: No  Lab Review: . Blood type: O . Rh Status: + . Antibody screen: Negative . HIV: Negative . RPR: Negative . Hemoglobin electrophoresis reviewed: Yes . Results of OB urine culture are: Negative . Rubella: Immune . Hep C Ab: Not done . Varicella status is Unknown   PMH: Reviewed and as detailed below: . HTN: No  . Gestational Hypertension/preeclampsia: No  . Type 1 or 2 Diabetes: No  . Depression:  No  . Seizure disorder:  No . VTE: No ,  . History of STI No,  . Abnormal Pap smear:  No, . Genital herpes simplex:  No   PSH: . Gynecologic Surgery:  no . Surgical history reviewed, notable for: no  Obstetric History: . Obstetric history tab updated and reviewed.  . Summary of prior pregnancies: recent SAB 06/2020 . Cesarean delivery: No  . Gestational Diabetes:  No . Hypertension in pregnancy: No . History of preterm birth: No . History of LGA/SGA infant:  No . History of shoulder dystocia: No . Indications for referral were reviewed, and the patient has no obstetric indications for referral to High Risk OB Clinic at this time.   Social History: . Partner's name: Jacqueline Yu.   . Tobacco use: No . Alcohol use:  No . Other substance use:  No  Current Medications:  . none  . Reviewed and appropriate in pregnancy.   Genetic and Infection  Screen: . Flow Sheet Updated Yes  Prenatal Exam: Gen: Well nourished, well developed.  No distress.  Vitals noted. HEENT: Normocephalic, atraumatic.  Neck supple without cervical lymphadenopathy, thyromegaly or thyroid nodules.  Fair dentition. CV: RRR no murmur, gallops or rubs Lungs: CTA B.  Normal respiratory effort without wheezes or rales. Abd: soft, NTND. +BS. Uterus not appreciated above pelvis. GU: Normal external female genitalia without lesions.  Nl vaginal, well rugated without lesions. No vaginal discharge.  Bimanual exam: No adnexal mass or TTP. No CMT.  Uterus size 12 Ext: No clubbing, cyanosis or edema. Psych: Normal grooming and dress.  Not depressed or anxious appearing.  Normal thought content and process without flight of ideas or looseness of associations  Assessment/Plan:  Jacqueline Yu is a 19 y.o. G2P0010 at [redacted]w[redacted]d who presents to initiate prenatal care. She is doing well.  Current pregnancy issues include nausea.  1. Routine prenatal care: Marland Kitchen LMP dating is reliable, a dating ultrasound has been done also . Pre-pregnancy weight updated. Expected weight gain this pregnancy is 15-25 pounds . Prenatal labs reviewed, notable for missing varicella and hep C. . Indications for referral to [facility] were reviewed and the patient does not meet criteria for referral.  . Medication list reviewed and updated.  . Recommended patient see a dentist for regular care.  . Bleeding and pain precautions reviewed. . Importance of prenatal vitamins reviewed.  . Genetic  screening offered. Patient opted for: quad screen at 16-20 weeks. . The patient does not have an indication for aspirin therapy beginning at 12-16 weeks. Aspirin was not  recommended today.  . The patient will not be age 55 or over at time of delivery. Referral to genetic counseling was not offered today.  . The patient has the following risk factors for preexisting diabetes:  An early 1 hour glucose tolerance test was not  ordered. Patient has BMI 37 and aa female but no other risk factors in history for gestational DM.  Initial random glucose 89. . Pregnancy Medical Home and PHQ-9 forms completed, problems noted: Yes  2. Pregnancy issues include the following which were addressed today:  . Needs varicella immunity confirmation and hep C screening at next appointment . Pap smear and Gc/CT screening today   Follow up 4 weeks for next prenatal visit.

## 2020-10-03 LAB — CERVICOVAGINAL ANCILLARY ONLY
Chlamydia: NEGATIVE
Comment: NEGATIVE
Comment: NORMAL
Neisseria Gonorrhea: NEGATIVE

## 2020-10-04 ENCOUNTER — Encounter: Payer: Self-pay | Admitting: Family Medicine

## 2020-10-04 DIAGNOSIS — R87810 Cervical high risk human papillomavirus (HPV) DNA test positive: Secondary | ICD-10-CM | POA: Insufficient documentation

## 2020-10-04 DIAGNOSIS — R87610 Atypical squamous cells of undetermined significance on cytologic smear of cervix (ASC-US): Secondary | ICD-10-CM | POA: Insufficient documentation

## 2020-10-04 LAB — CYTOLOGY - PAP
Chlamydia: NEGATIVE
Comment: NEGATIVE
Comment: NEGATIVE
Comment: NORMAL
Diagnosis: UNDETERMINED — AB
High risk HPV: POSITIVE — AB
Neisseria Gonorrhea: NEGATIVE

## 2020-10-05 ENCOUNTER — Encounter: Payer: Self-pay | Admitting: Family Medicine

## 2020-10-05 ENCOUNTER — Telehealth: Payer: Self-pay | Admitting: Family Medicine

## 2020-10-05 NOTE — Telephone Encounter (Signed)
Called and reviewed Pap results.   All questions answered.   Terisa Starr, MD  Family Medicine Teaching Service

## 2020-10-10 ENCOUNTER — Inpatient Hospital Stay (HOSPITAL_COMMUNITY)
Admission: AD | Admit: 2020-10-10 | Discharge: 2020-10-11 | Disposition: A | Discharge status: Home / Self Care | Payer: Medicaid Other | Attending: Obstetrics and Gynecology | Admitting: Obstetrics and Gynecology

## 2020-10-10 ENCOUNTER — Encounter (HOSPITAL_COMMUNITY): Payer: Self-pay | Admitting: Obstetrics and Gynecology

## 2020-10-10 ENCOUNTER — Other Ambulatory Visit: Payer: Self-pay

## 2020-10-10 DIAGNOSIS — O99611 Diseases of the digestive system complicating pregnancy, first trimester: Secondary | ICD-10-CM

## 2020-10-10 DIAGNOSIS — Z79899 Other long term (current) drug therapy: Secondary | ICD-10-CM | POA: Diagnosis not present

## 2020-10-10 DIAGNOSIS — Z3A12 12 weeks gestation of pregnancy: Secondary | ICD-10-CM | POA: Insufficient documentation

## 2020-10-10 DIAGNOSIS — K529 Noninfective gastroenteritis and colitis, unspecified: Secondary | ICD-10-CM | POA: Diagnosis not present

## 2020-10-10 DIAGNOSIS — O219 Vomiting of pregnancy, unspecified: Secondary | ICD-10-CM | POA: Diagnosis not present

## 2020-10-10 DIAGNOSIS — O26891 Other specified pregnancy related conditions, first trimester: Secondary | ICD-10-CM | POA: Insufficient documentation

## 2020-10-10 LAB — URINALYSIS, ROUTINE W REFLEX MICROSCOPIC
Bilirubin Urine: NEGATIVE
Glucose, UA: NEGATIVE mg/dL
Hgb urine dipstick: NEGATIVE
Ketones, ur: 5 mg/dL — AB
Leukocytes,Ua: NEGATIVE
Nitrite: NEGATIVE
Protein, ur: NEGATIVE mg/dL
Specific Gravity, Urine: 1.023 (ref 1.005–1.030)
pH: 5 (ref 5.0–8.0)

## 2020-10-10 MED ORDER — DEXAMETHASONE SODIUM PHOSPHATE 10 MG/ML IJ SOLN
10.0000 mg | Freq: Once | INTRAMUSCULAR | Status: AC
  Administered 2020-10-10: 10 mg via INTRAVENOUS
  Filled 2020-10-10: qty 1

## 2020-10-10 MED ORDER — METOCLOPRAMIDE HCL 5 MG/ML IJ SOLN
10.0000 mg | Freq: Once | INTRAMUSCULAR | Status: AC
  Administered 2020-10-10: 10 mg via INTRAVENOUS
  Filled 2020-10-10: qty 2

## 2020-10-10 MED ORDER — PROMETHAZINE HCL 12.5 MG PO TABS
12.5000 mg | ORAL_TABLET | Freq: Four times a day (QID) | ORAL | 1 refills | Status: DC | PRN
Start: 2020-10-10 — End: 2020-12-28

## 2020-10-10 MED ORDER — ONDANSETRON HCL 4 MG/2ML IJ SOLN
4.0000 mg | Freq: Once | INTRAMUSCULAR | Status: AC
  Administered 2020-10-10: 4 mg via INTRAVENOUS
  Filled 2020-10-10: qty 2

## 2020-10-10 MED ORDER — M.V.I. ADULT IV INJ
Freq: Once | INTRAVENOUS | Status: AC
  Filled 2020-10-10: qty 10

## 2020-10-10 MED ORDER — LACTATED RINGERS IV BOLUS
1000.0000 mL | Freq: Once | INTRAVENOUS | Status: AC
  Administered 2020-10-10: 1000 mL via INTRAVENOUS

## 2020-10-10 MED ORDER — DIPHENHYDRAMINE HCL 50 MG/ML IJ SOLN
25.0000 mg | Freq: Once | INTRAMUSCULAR | Status: AC
  Administered 2020-10-10: 25 mg via INTRAVENOUS
  Filled 2020-10-10: qty 1

## 2020-10-10 NOTE — MAU Note (Signed)
Pt reports nausea improved-medications given for pt report of headache. Lights off - side rails up x 2 call bell within reach. SO at bedisde

## 2020-10-10 NOTE — MAU Provider Note (Signed)
History     CSN: [id number]  Arrival date and time: 10/10/20 2018   First Provider Initiated Contact with Patient 10/10/20 2053       Chief Complaint  Patient presents with  . Nausea  . Emesis   Jacqueline Yu is 19 y.o. G2P0 at [redacted]w[redacted]d by LMP who presents to MAU with complaints of emesis and loose stools. Patient reports emesis started occurring last night, reports vomiting 6 times over the past 24 hours. She reports that emesis has been on/off since finding out she was pregnant, has not taken medication she was prescribed due to "it costing too much", patient does not remember name of medication she was prescribed for nausea. According to chart review she was prescribed zofran. Patient also reports two loose stools this morning. Denies recent changes in diet. Patient denies vaginal bleeding, discharge or abdominal pain.    OB History    Gravida  2   Para  0   Term  0   Preterm  0   AB  1   Living  0     SAB  1   TAB      Ectopic      Multiple      Live Births              Past Medical History:  Diagnosis Date  . Medical history non-contributory   . Obesity     Past Surgical History:  Procedure Laterality Date  . NO PAST SURGERIES      Family History  Problem Relation Age of Onset  . Hypertension Mother   . Hyperlipidemia Mother   . Migraines Mother   . Anxiety disorder Mother   . Schizophrenia Brother   . Depression Brother   . Anxiety disorder Brother   . Schizophrenia Maternal Grandmother   . Depression Maternal Grandmother   . Anxiety disorder Maternal Grandmother     Social History   Tobacco Use  . Smoking status: Never Smoker  . Smokeless tobacco: Never Used  Vaping Use  . Vaping Use: Never used  Substance Use Topics  . Alcohol use: Not Currently  . Drug use: Not Currently    Types: Marijuana    Allergies:  Allergies  Allergen Reactions  . Peanuts [Peanut Oil]     Scratchy throat    Medications Prior to Admission   Medication Sig Dispense Refill Last Dose  . Prenatal Vit-Fe Fumarate-FA (MULTIVITAMIN-PRENATAL) 27-0.8 MG TABS tablet Take 1 tablet by mouth daily at 12 noon.   10/09/2020 at Unknown time  . cephALEXin (KEFLEX) 500 MG capsule Take 500 mg by mouth 4 (four) times daily. 2 days remaining     . famotidine (PEPCID) 20 MG tablet Take 1 tablet (20 mg total) by mouth daily. 30 tablet 1   . ondansetron (ZOFRAN ODT) 4 MG disintegrating tablet Take 1 tablet (4 mg total) by mouth every 8 (eight) hours as needed for nausea or vomiting. 30 tablet 1     Review of Systems  Constitutional: Negative.   Respiratory: Negative.   Cardiovascular: Negative.   Gastrointestinal: Positive for diarrhea and vomiting. Negative for abdominal pain.  Genitourinary: Negative.   Musculoskeletal: Negative.   Neurological: Negative.   Psychiatric/Behavioral: Negative.    Physical Exam   Blood pressure 125/66, pulse 79, temperature 98.7 F (37.1 C), temperature source Oral, resp. rate 14, weight 100.2 kg, last menstrual period 07/15/2020, SpO2 98 %.  Physical Exam Vitals and nursing note reviewed.  HENT:  Head: Normocephalic.  Cardiovascular:     Rate and Rhythm: Normal rate and regular rhythm.  Pulmonary:     Effort: Pulmonary effort is normal. No respiratory distress.     Breath sounds: Normal breath sounds. No wheezing.  Abdominal:     General: There is no distension.     Palpations: Abdomen is soft. There is no mass.     Tenderness: There is no abdominal tenderness. There is no right CVA tenderness, left CVA tenderness or guarding.  Skin:    General: Skin is warm and dry.  Neurological:     Mental Status: She is alert and oriented to person, place, and time.  Psychiatric:        Mood and Affect: Mood normal.        Behavior: Behavior normal.        Thought Content: Thought content normal.    MAU Course  Procedures  MDM UA reviewed Results for orders placed or performed during the hospital  encounter of 10/10/20 (from the past 24 hour(s))  Urinalysis, Routine w reflex microscopic Urine, Clean Catch     Status: Abnormal   Collection Time: 10/10/20  8:38 PM  Result Value Ref Range   Color, Urine YELLOW YELLOW   APPearance CLOUDY (A) CLEAR   Specific Gravity, Urine 1.023 1.005 - 1.030   pH 5.0 5.0 - 8.0   Glucose, UA NEGATIVE NEGATIVE mg/dL   Hgb urine dipstick NEGATIVE NEGATIVE   Bilirubin Urine NEGATIVE NEGATIVE   Ketones, ur 5 (A) NEGATIVE mg/dL   Protein, ur NEGATIVE NEGATIVE mg/dL   Nitrite NEGATIVE NEGATIVE   Leukocytes,Ua NEGATIVE NEGATIVE   Small amount of ketones present, possible gastroenteritis will give patient IV fluids and medication.   Meds ordered this encounter  Medications  . lactated ringers bolus 1,000 mL  . ondansetron (ZOFRAN) injection 4 mg  . multivitamins adult (INFUVITE ADULT) 10 mL in lactated ringers 1,000 mL infusion  . AND Linked Order Group   . diphenhydrAMINE (BENADRYL) injection 25 mg   . metoCLOPramide (REGLAN) injection 10 mg   . dexamethasone (DECADRON) injection 10 mg   @2135  patient reports to nurse that she also has a HA. Rates HA 7/10 - requesting pain medication. HA cocktail ordered at this time.   Reassessment @ 2231 - patient asleep in room. Multivitamin bag infusing - about 700cc left. Will reassess once MVI completed.   Patient reports that HA is completely resolved. Denies continued nausea. No emesis while in MAU. Educated and discussed changes in medication due to pricing. Rx for Phenergan sent to pharmacy of choice.   Discussed reasons to return to MAU. Make appointment for change in care - patient plans to go to [facility] office for prenatal care instead of [facility]. Return to MAU as needed. Pt stable at time of discharge.   Assessment and Plan   1. Gastroenteritis, acute   2. Nausea and vomiting during pregnancy   3. [redacted] weeks gestation of pregnancy    Discharge home Rx for phenergan  Return to MAU as needed for reasons  discussed and/or emergencies    Follow-up Information    [facility]. Schedule an appointment as soon as possible for a visit.   Specialty: Obstetrics and Gynecology Why: Make appointment to be seen for prenatal care Contact information: [address] [facility] [place] [phone]             Allergies as of 10/11/2020  Reactions   Peanuts [peanut Oil]    Scratchy throat      Medication List    STOP taking these medications   cephALEXin 500 MG capsule Commonly known as: KEFLEX   famotidine 20 MG tablet Commonly known as: Pepcid   ondansetron 4 MG disintegrating tablet Commonly known as: Zofran ODT     TAKE these medications   multivitamin-prenatal 27-0.8 MG Tabs tablet Take 1 tablet by mouth daily at 12 noon.   promethazine 12.5 MG tablet Commonly known as: PHENERGAN Take 1 tablet (12.5 mg total) by mouth every 6 (six) hours as needed for nausea or vomiting.       Sharyon Cable CNM 10/11/2020, 12:08 AM

## 2020-10-10 NOTE — MAU Note (Signed)
.   Jacqueline Yu is a 19 y.o. at [redacted]w[redacted]d here in MAU reporting: nausea and vomiting that started last night. She has thrown up x6 in the past 24 hours and has had x2 loose stools. Also reports headache. Has not been able to keep food down today. No VB or abnormal discharge.  Pain score: 7 Vitals:   10/10/20 2023  BP: (!) 145/85  Pulse: 89  Resp: 14  Temp: 98.7 F (37.1 C)  SpO2: 98%     FHT:172 Lab orders placed from triage: UA

## 2020-10-11 NOTE — MAU Note (Signed)
Pt reports headache resolved and nausea improved.

## 2020-10-12 ENCOUNTER — Encounter: Payer: Self-pay | Admitting: Family Medicine

## 2020-10-14 NOTE — Telephone Encounter (Signed)
Patient called and scheduled for Tuesday afternoon.  Veronda Prude, RN

## 2020-10-18 ENCOUNTER — Encounter: Payer: Medicaid Other | Admitting: Family Medicine

## 2020-10-18 ENCOUNTER — Other Ambulatory Visit: Payer: Self-pay

## 2020-10-18 ENCOUNTER — Other Ambulatory Visit (HOSPITAL_COMMUNITY)
Admission: RE | Admit: 2020-10-18 | Discharge: 2020-10-18 | Disposition: A | Discharge status: Home / Self Care | Payer: Medicaid Other | Source: Ambulatory Visit | Attending: Family Medicine | Admitting: Family Medicine

## 2020-10-18 ENCOUNTER — Ambulatory Visit (INDEPENDENT_AMBULATORY_CARE_PROVIDER_SITE_OTHER): Payer: Medicaid Other | Admitting: Family Medicine

## 2020-10-18 VITALS — BP 130/82 | HR 99 | Wt 221.0 lb

## 2020-10-18 DIAGNOSIS — O26899 Other specified pregnancy related conditions, unspecified trimester: Secondary | ICD-10-CM | POA: Diagnosis present

## 2020-10-18 DIAGNOSIS — N898 Other specified noninflammatory disorders of vagina: Secondary | ICD-10-CM | POA: Insufficient documentation

## 2020-10-18 DIAGNOSIS — Z362 Encounter for other antenatal screening follow-up: Secondary | ICD-10-CM | POA: Diagnosis not present

## 2020-10-18 LAB — POCT WET PREP (WET MOUNT)
Clue Cells Wet Prep Whiff POC: NEGATIVE
Trichomonas Wet Prep HPF POC: ABSENT

## 2020-10-18 NOTE — Progress Notes (Deleted)
   Subjective:   Patient ID: Jacqueline Yu    DOB: [birth year], 19 y.o. female   MRN: [id number]  Jacqueline Yu is a 19 y.o. female currently G2P0010 at [redacted]w[redacted]d here for Vaginal Discharge.  Vaginal Discharge: LMP: Contraception: Sexually active: STD testing?  Review of Systems:  Per HPI.   Objective:   LMP 07/15/2020  Vitals and nursing note reviewed.  General: pleasant ***, sitting comfortably in exam chair, well nourished, well developed, in no acute distress with non-toxic appearance HEENT: normocephalic, atraumatic, moist mucous membranes, oropharynx clear without erythema or exudate, TM normal bilaterally  Neck: supple, non-tender without lymphadenopathy CV: regular rate and rhythm without murmurs, rubs, or gallops, no lower extremity edema, 2+ radial and pedal pulses bilaterally Lungs: clear to auscultation bilaterally with normal work of breathing on room air Resp: breathing comfortably on room air, speaking in full sentences Abdomen: soft, non-tender, non-distended, no masses or organomegaly palpable, normoactive bowel sounds Skin: warm, dry, no rashes or lesions Extremities: warm and well perfused, normal tone MSK: ROM grossly intact, strength intact, gait normal Neuro: Alert and oriented, speech normal  Assessment & Plan:   No problem-specific Assessment & Plan notes found for this encounter.  No orders of the defined types were placed in this encounter.  No orders of the defined types were placed in this encounter.   Orpah Cobb, DO PGY-3, [facility] 10/18/2020 8:18 AM

## 2020-10-18 NOTE — Progress Notes (Signed)
   Subjective:   Patient ID: Jacqueline Yu    DOB: 02-27-2001, 19 y.o. female   MRN: [id number]  Jacqueline Yu is a 19 y.o. female currently G2P0010 at [redacted]w[redacted]d here for Vaginal Discharge.  Vaginal Discharge: Patient notes she has been having white discharge for the past 5 days.  She notes that it itches.  She is sexually active with one female partner.  Denies any bleeding, contractions, gushes of fluid.  She is taking her prenatal vitamin.  Denies any pelvic pain.  Review of Systems:  Per HPI.   Objective:   BP 130/82   Pulse 99   Wt 221 lb (100.2 kg)   LMP 07/15/2020   BMI 37.35 kg/m  Vitals and nursing note reviewed.  General: pleasant young female, sitting comfortably on exam bed, well nourished, well developed, in no acute distress with non-toxic appearance Resp: breathing comfortably on room air, speaking in full sentences Abdomen: soft, non-tender Skin: warm, dry Extremities: warm and well perfused Neuro: Alert and oriented, speech normal Pelvic exam: VULVA: normal appearing vulva with no masses, tenderness or lesions, VAGINA: normal appearing vagina with normal color and discharge, no lesions, vaginal discharge - white, curd-like and thick, CERVIX: normal appearing cervix without discharge or lesions, nulliparous os Exam chaperoned by Jone Baseman.  Assessment & Plan:   Vaginal discharge during pregnancy, antepartum Wet prep negative, however I find this surprising as my physical exam findings are strongly suggest candidiasis. Will empirically treat given symptoms despite testing. If no improvement, she should return for further evaluation.  - OTC Monistat  - clean underwear, change out of sweaty/wet clothes promptly  - follow up GC/Cl  Fetal heart rate difficult to assess with fetal doppler during exam. Given history of miscarriage and to provide reassurance to mom, obtained ultrasound view in which cardiac activity was appreciated.   Orders Placed This Encounter   Procedures  . POCT Wet Prep [facility])   No orders of the defined types were placed in this encounter.   Orpah Cobb, DO PGY-3, [facility] 10/18/2020 6:20 PM

## 2020-10-18 NOTE — Assessment & Plan Note (Signed)
Wet prep negative, however I find this surprising as my physical exam findings are strongly suggest candidiasis. Will empirically treat given symptoms despite testing. If no improvement, she should return for further evaluation.  - OTC Monistat  - clean underwear, change out of sweaty/wet clothes promptly  - follow up GC/Cl

## 2020-10-20 LAB — CERVICOVAGINAL ANCILLARY ONLY
Chlamydia: NEGATIVE
Comment: NEGATIVE
Comment: NORMAL
Neisseria Gonorrhea: NEGATIVE

## 2020-10-25 ENCOUNTER — Encounter: Payer: Self-pay | Admitting: Student in an Organized Health Care Education/Training Program

## 2020-10-25 ENCOUNTER — Telehealth: Payer: Self-pay | Admitting: Family Medicine

## 2020-10-25 NOTE — Telephone Encounter (Signed)
Patient calls back to nurse line. Patient advised she needs to get covid tested prior to next weeks apt. Patient voiced understanding.

## 2020-10-25 NOTE — Telephone Encounter (Signed)
Attempted to call patient about MyChart messages-Nursing-Please schedule for an appointment for cold symptoms.   Terisa Starr, MD  Family Medicine Teaching Service

## 2020-10-25 NOTE — Telephone Encounter (Signed)
Attempted to call pt no answer and no voicemail to leave message. If pt calls back let her know to get covid tested before being seen in clinic per dr brown. She has an OB appt with dr. Dareen Piano next week. Hoy Fallert Bruna Potter, CMA

## 2020-11-01 ENCOUNTER — Other Ambulatory Visit: Payer: Self-pay

## 2020-11-01 ENCOUNTER — Ambulatory Visit (INDEPENDENT_AMBULATORY_CARE_PROVIDER_SITE_OTHER): Payer: Medicaid Other | Admitting: Student in an Organized Health Care Education/Training Program

## 2020-11-01 VITALS — BP 128/80 | HR 90 | Wt 219.4 lb

## 2020-11-01 DIAGNOSIS — Z3491 Encounter for supervision of normal pregnancy, unspecified, first trimester: Secondary | ICD-10-CM

## 2020-11-01 DIAGNOSIS — Z3A20 20 weeks gestation of pregnancy: Secondary | ICD-10-CM

## 2020-11-01 NOTE — Progress Notes (Signed)
Jacqueline Yu is a 19 y.o. G2P0010 at [redacted]w[redacted]d here for routine follow up. She is dated by LMP.  She reports nausea, no bleeding, no contractions, no cramping and no leaking.  She denies vaginal bleeding.  See flow sheet for details.  Vitals:   11/01/20 1614  BP: 128/80  Pulse: 90    A/P: Pregnancy at [redacted]w[redacted]d.  Doing well.   . Dating reviewed, dating tab is correct . Fetal heart tones Appropriate . Pregnancy issues include nausea/vomiting which is improving, weight loss. . Problem list updated: Yes.  . Influenza vaccine not administered as patient declined, will continue to discuss.   . The patient has the following indication for screening preexisting diabetes: BMI > 25 and high risk ethnicity (Latino, [place] American, Native American, [place] Islander, Asian Naval architect) . Marland Kitchen Anatomy ultrasound ordered to be scheduled at 18-20 weeks. . Patient is interested in genetic screening. As she is past 13 weeks and 6 days, a Quad screen  was offered.  . Pregnancy education including expected weight gain in pregnancy, OTC medication use, continued use of prenatal vitamin, smoking cessation if applicable, and nutrition in pregnancy.   . Bleeding and pain precautions reviewed. . Follow up 4 weeks.  Tuesday next week. Quad screen and Glucola.

## 2020-11-01 NOTE — Patient Instructions (Signed)
It was a pleasure to see you today!  To summarize our discussion for this visit:  Your pregnancy is going well.   I'm glad to hear that your nausea has improved. Lets keep a close eye on your weight next visit to make sure you aren't losing any more.  Please come in next week for a diabetes screening and your quad screening at the same time. (quad is genetic testing)  Your next visit with [place] will be in 4 weeks.  We'll call you tomorrow with your anatomy ultrasound appointment.  Some additional health maintenance measures we should update are: Health Maintenance Due  Topic Date Due  . COVID-19 Vaccine (1) Never done  .    Please return to our clinic to see me in 1 month.  Call the clinic at [phone] if your symptoms worsen or you have any concerns.   Thank you for allowing me to take part in your care,  Dr. Jamelle Rushing ]  Genetic Testing During Pregnancy Genetic testing during pregnancy is also called prenatal genetic testing. This type of testing can determine if your baby is at risk of being born with a disorder caused by abnormal genes or chromosomes (genetic disorder). Chromosomes contain genes that control how your baby will develop in your womb. There are many different genetic disorders. Examples of genetic disorders that may be found through genetic testing include Down syndrome and cystic fibrosis. Gene changes (mutations) can be passed down through families. Genetic testing is offered to all women before or during pregnancy. You can choose whether to have genetic testing. Why is genetic testing done? Genetic testing is done during pregnancy to find out whether your child is at risk for a genetic disorder. Having genetic testing allows you to:  Discuss your test results and options with a genetic counselor.  Prepare for a baby that may be born with a genetic disorder. Learning about the disorder ahead of time helps you be better prepared to manage it. Your  health care providers can also be prepared in case your baby requires special care before or after birth.  Consider whether you want to continue with the pregnancy. In some cases, genetic testing may be done to learn about the traits a child will inherit. Types of genetic tests There are two basic types of genetic testing. Screening tests indicate whether your developing baby (fetus) is at higher risk for a genetic disorder. Diagnostic tests check actual fetal cells to diagnose a genetic disorder. Screening tests     Screening tests will not harm your baby. They are recommended for all pregnant women. Types of screening tests include:  Carrier screening. This test involves checking genes from both parents by testing their blood or saliva. The test checks to find out if the parents carry a genetic mutation that may be passed to a baby. In most cases, both parents must carry the mutation for a baby to be at risk.  First trimester screening. This test combines a blood test with sound wave imaging of your baby (fetal ultrasound). This screening test checks for a risk of Down syndrome or other defects caused by having extra chromosomes. It also checks for defects of the heart, abdomen, or skeleton.  Second trimester screening also combines a blood test with a fetal ultrasound exam. It checks for a risk of genetic defects of the face, brain, spine, heart, or limbs.  Combined or sequential screening. This type of testing combines the results of first and second trimester  screening. This type of testing may be more accurate than first or second trimester screening alone.  Cell-free DNA testing. This is a blood test that detects cells released by the placenta that get into the mother's blood. It can be used to check for a risk of Down syndrome, other extra chromosome syndromes, and disorders caused by abnormal numbers of sex chromosomes. This test can be done any time after 10 weeks of  pregnancy.  Diagnostic tests Diagnostic tests carry slight risks of problems, including bleeding, infection, and loss of the pregnancy. These tests are done only if your baby is at risk for a genetic disorder. You may meet with a genetic counselor to discuss the risks and benefits before having diagnostic tests. Examples of diagnostic tests include:  Chorionic villus sampling (CVS). This involves a procedure to remove and test a sample of cells taken from the placenta. The procedure may be done between 10 and 12 weeks of pregnancy.  Amniocentesis. This involves a procedure to remove and test a sample of fluid (amniotic fluid) and cells from the sac that surrounds the developing baby. The procedure may be done between 15 and 20 weeks of pregnancy. What do the results mean? For a screening test:  If the results are negative, it often means that your child is not at higher risk. There is still a slight chance your child could have a genetic disorder.  If the results are positive, it does not mean your child will have a genetic disorder. It may mean that your child has a higher-than-normal risk for a genetic disorder. In that case, you may want to talk with a genetic counselor about whether you should have diagnostic genetic tests. For a diagnostic test:  If the result is negative, it is unlikely that your child will have a genetic disorder.  If the test is positive for a genetic disorder, it is likely that your child will have the disorder. The test may not tell how severe the disorder will be. Talk with your health care provider about your options. Questions to ask your health care provider Before talking to your health care provider about genetic testing, find out if there is a history of genetic disorders in your family. It may also help to know your family's ethnic origins. Then ask your health care provider the following questions:  Is my baby at risk for a genetic disorder?  What are the  benefits of having genetic screening?  What tests are best for me and my baby?  What are the risks of each test?  If I get a positive result on a screening test, what is the next step?  Should I meet with a genetic counselor before having a diagnostic test?  Should my partner or other members of my family be tested?  How much do the tests cost? Will my insurance cover the testing? Summary  Genetic testing is done during pregnancy to find out whether your child is at risk for a genetic disorder.  Genetic testing is offered to all women before or during pregnancy. You can choose whether to have genetic testing.  There are two basic types of genetic testing. Screening tests indicate whether your developing baby (fetus) is at higher risk for a genetic disorder. Diagnostic tests check actual fetal cells to diagnose a genetic disorder.  If a diagnostic genetic test is positive, talk with your health care provider about your options. This information is not intended to replace advice given to you by  your health care provider. Make sure you discuss any questions you have with your health care provider. Document Revised: 02/19/2019 Document Reviewed: 01/13/2018 Elsevier Patient Education  2020 [employer].

## 2020-11-01 NOTE — Progress Notes (Signed)
routine

## 2020-11-07 ENCOUNTER — Encounter: Payer: Self-pay | Admitting: Student in an Organized Health Care Education/Training Program

## 2020-11-09 ENCOUNTER — Other Ambulatory Visit (INDEPENDENT_AMBULATORY_CARE_PROVIDER_SITE_OTHER): Payer: Medicaid Other

## 2020-11-09 ENCOUNTER — Inpatient Hospital Stay (HOSPITAL_COMMUNITY)
Admission: AD | Admit: 2020-11-09 | Discharge: 2020-11-09 | Disposition: A | Discharge status: Home / Self Care | Payer: Medicaid Other | Attending: Family Medicine | Admitting: Family Medicine

## 2020-11-09 ENCOUNTER — Other Ambulatory Visit: Payer: Self-pay

## 2020-11-09 DIAGNOSIS — Z711 Person with feared health complaint in whom no diagnosis is made: Secondary | ICD-10-CM

## 2020-11-09 DIAGNOSIS — Z3A16 16 weeks gestation of pregnancy: Secondary | ICD-10-CM | POA: Insufficient documentation

## 2020-11-09 DIAGNOSIS — O26892 Other specified pregnancy related conditions, second trimester: Secondary | ICD-10-CM | POA: Diagnosis not present

## 2020-11-09 DIAGNOSIS — R109 Unspecified abdominal pain: Secondary | ICD-10-CM | POA: Insufficient documentation

## 2020-11-09 DIAGNOSIS — Z3491 Encounter for supervision of normal pregnancy, unspecified, first trimester: Secondary | ICD-10-CM | POA: Diagnosis present

## 2020-11-09 LAB — URINALYSIS, ROUTINE W REFLEX MICROSCOPIC
Bilirubin Urine: NEGATIVE
Glucose, UA: NEGATIVE mg/dL
Hgb urine dipstick: NEGATIVE
Ketones, ur: NEGATIVE mg/dL
Leukocytes,Ua: NEGATIVE
Nitrite: NEGATIVE
Protein, ur: NEGATIVE mg/dL
Specific Gravity, Urine: 1.02 (ref 1.005–1.030)
pH: 5 (ref 5.0–8.0)

## 2020-11-09 LAB — COMPREHENSIVE METABOLIC PANEL
ALT: 37 U/L (ref 0–44)
AST: 22 U/L (ref 15–41)
Albumin: 3.3 g/dL — ABNORMAL LOW (ref 3.5–5.0)
Alkaline Phosphatase: 65 U/L (ref 38–126)
Anion gap: 9 (ref 5–15)
BUN: 6 mg/dL (ref 6–20)
CO2: 25 mmol/L (ref 22–32)
Calcium: 9.8 mg/dL (ref 8.9–10.3)
Chloride: 102 mmol/L (ref 98–111)
Creatinine, Ser: 0.56 mg/dL (ref 0.44–1.00)
GFR, Estimated: >60 mL/min (ref >60)
Glucose, Bld: 113 mg/dL — ABNORMAL HIGH (ref 70–99)
Potassium: 3.6 mmol/L (ref 3.5–5.1)
Sodium: 136 mmol/L (ref 135–145)
Total Bilirubin: 0.3 mg/dL (ref 0.3–1.2)
Total Protein: 7.1 g/dL (ref 6.5–8.1)

## 2020-11-09 LAB — CBC
HCT: 37.8 % (ref 36.0–46.0)
Hemoglobin: 12.2 g/dL (ref 12.0–15.0)
MCH: 27.9 pg (ref 26.0–34.0)
MCHC: 32.3 g/dL (ref 30.0–36.0)
MCV: 86.3 fL (ref 80.0–100.0)
Platelets: 352 10*3/uL (ref 150–400)
RBC: 4.38 MIL/uL (ref 3.87–5.11)
RDW: 13.9 % (ref 11.5–15.5)
WBC: 6.7 10*3/uL (ref 4.0–10.5)
nRBC: 0 % (ref 0.0–0.2)

## 2020-11-09 LAB — POCT 1 HR PRENATAL GLUCOSE: Glucose 1 Hr Prenatal, POC: 141 mg/dL

## 2020-11-09 NOTE — MAU Note (Signed)
Written and verbal d/c instructions given and understanding voiced. Pt d/c home from [place] room.

## 2020-11-09 NOTE — MAU Provider Note (Signed)
Event Date/Time   First Provider Initiated Contact with Patient 11/09/20 2125     S Ms. Jacqueline Yu is a 19 y.o. G2P0010 patient who presents to MAU today with complaint of weight loss during pregnancy and abdominal pain. Patient reports that prior to pregnancy she was 230 and has continued to loose about 16lbs over the course of this pregnancy. Patient reports that she eats 2-3 times a day. Patient reports abdominal pain that is intermittent- denies pain currently. Denies vaginal bleeding or discharge.   O BP 124/72   Pulse 81   Temp 98.5 F (36.9 C)   Resp 16   Ht 5\' 4"  (1.626 m)   Wt 98 kg   LMP 07/15/2020   BMI 37.08 kg/m  Physical Exam Vitals and nursing note reviewed.  Constitutional:      General: She is not in acute distress.    Appearance: Normal appearance.  Cardiovascular:     Rate and Rhythm: Normal rate and regular rhythm.  Pulmonary:     Effort: Pulmonary effort is normal. No respiratory distress.     Breath sounds: Normal breath sounds. No wheezing.  Skin:    General: Skin is warm and dry.  Neurological:     Mental Status: She is alert and oriented to person, place, and time.  Psychiatric:        Mood and Affect: Mood normal.        Behavior: Behavior normal.        Thought Content: Thought content normal.    FHR 150 by doppler  Educated and discussed with patient recommended weight gain during pregnancy, discussed with patient to eat 3 times a day and increase protein in diet. Discussed with patient addition of protein shake or ensure in addition to the 3 meals.   Educated and discussed with patient RLP and what to expect with growing uterus. Answered both patient's and partner's questions.   Discussed reasons to return to MAU. Follow up as scheduled in the office. Return to MAU as needed. Pt stable at time of discharge.    A Medical screening exam complete 1. Physically well but worried   2. [redacted] weeks gestation of pregnancy    P Discharge from MAU in  stable condition Follow up as scheduled in the office for prenatal care Return to MAU as needed for reasons discussed and/or emergencies   09/14/2020, CNM 11/09/2020 10:08 PM

## 2020-11-09 NOTE — MAU Note (Signed)
Pt is concerned that she is loosing wt. States last wt before today was 219lb. Having mild cramping off and on. Less pain currently. Denies VB or d/c

## 2020-11-09 NOTE — MAU Note (Signed)
Veronic Rogers CNM in Family Rm to see pt and discuss plan of care

## 2020-11-09 NOTE — Discharge Instructions (Signed)
Premier protein shakes or ensure

## 2020-11-10 ENCOUNTER — Telehealth: Payer: Self-pay | Admitting: Family Medicine

## 2020-11-10 NOTE — Telephone Encounter (Signed)
Called patient.  See phone note

## 2020-11-10 NOTE — Telephone Encounter (Signed)
Called patient to f/u mychart message.  She was seen in MAU yesterday and told that everything was fine and she should drink protein shakes.  No other concerns.

## 2020-11-12 NOTE — L&D Delivery Note (Addendum)
OB/GYN Faculty Practice Delivery Note  Jacqueline Yu is a 20 y.o. G2P0010 s/p preterm vaginal delivery at [redacted]w[redacted]d. She was admitted on 03/24/21 for severe preeclampsia.   ROM: 23h 59m with clear fluid GBS Status: negative Maximum Maternal Temperature: 98.37F  Labor Progress: Pt was admitted on 03/24/21 for severe preeclampsia and started on magnesium for seizure prophlyaxis. On admission, cytotec x2 was administered and FB was placed. Pt was initially started on pitocin on 5/14 but given minimal change was switched back to cytotec x4. Pt was restarted on pitocin at 0415 on 5/14. AROM for clear fluid at 1028 on 5/15. Pitocin was again stopped and pt received an additional dose of cytotec x1. Amnioinfusion was started given recurrent variable decels. Pitocin was ultimately restarted at 0220 on 5/15. Pt then progressed to complete cervical dilation at 1013. She then had an uncomplicated delivery s/p a brief second stage as noted below.  Delivery Date/Time: 03/27/21 at 1021 Delivery: Called to room and patient was complete and pushing. Head delivered ROA. No nuchal cord present. Shoulder and body delivered in usual fashion. Infant with spontaneous cry, placed on mother's abdomen, dried and stimulated. Cord clamped x 2 after 1-minute delay, and cut by FOB under my direct supervision. Cord blood drawn. Placenta delivered spontaneously with gentle cord traction. Fundus firm with massage and Pitocin. Labia, perineum, vagina, and cervix were inspected, without evidence for lacerations. NICU team present for delivery.   Placenta: intact, 3-vessel cord, marginal cord insertion, sent to pathology Complications: none Lacerations: none EBL: 250 ml Analgesia: epidural  Infant: viable female  APGARs 8 & 9  weight 1880g  Lynnda Shields, MD OB/GYN Fellow, Faculty Practice

## 2020-11-14 ENCOUNTER — Telehealth: Payer: Self-pay | Admitting: Family Medicine

## 2020-11-14 ENCOUNTER — Encounter: Payer: Self-pay | Admitting: Student in an Organized Health Care Education/Training Program

## 2020-11-14 NOTE — Telephone Encounter (Signed)
Called patient discussed that she can eat until midnight. All questions answered.  Terisa Starr, MD  Family Medicine Teaching Service

## 2020-11-15 ENCOUNTER — Other Ambulatory Visit: Payer: Medicaid Other

## 2020-11-15 ENCOUNTER — Other Ambulatory Visit: Payer: Self-pay

## 2020-11-15 DIAGNOSIS — Z3491 Encounter for supervision of normal pregnancy, unspecified, first trimester: Secondary | ICD-10-CM

## 2020-11-15 DIAGNOSIS — Z3A20 20 weeks gestation of pregnancy: Secondary | ICD-10-CM

## 2020-11-16 ENCOUNTER — Telehealth: Payer: Self-pay | Admitting: Family Medicine

## 2020-11-16 DIAGNOSIS — O24112 Pre-existing diabetes mellitus, type 2, in pregnancy, second trimester: Secondary | ICD-10-CM

## 2020-11-16 LAB — GESTATIONAL GLUCOSE TOLERANCE
Glucose, Fasting: 85 mg/dL (ref 65–94)
Glucose, GTT - 1 Hour: 185 mg/dL — ABNORMAL HIGH (ref 65–179)
Glucose, GTT - 2 Hour: 166 mg/dL — ABNORMAL HIGH (ref 65–154)
Glucose, GTT - 3 Hour: 120 mg/dL (ref 65–139)

## 2020-11-16 MED ORDER — ACCU-CHEK GUIDE W/DEVICE KIT: PACK | 0 refills | Status: DC

## 2020-11-16 MED ORDER — GLUCOSE BLOOD VI STRP: ORAL_STRIP | 12 refills | Status: DC

## 2020-11-16 MED ORDER — ACCU-CHEK SOFTCLIX LANCETS MISC: 12 refills | Status: DC

## 2020-11-16 NOTE — Telephone Encounter (Signed)
Quad screen negative, called with results.  Terisa Starr, MD  Family Medicine Teaching Service

## 2020-11-16 NOTE — Telephone Encounter (Signed)
Called with results of test for pre-existing diabetes.  Based upon [employer]  criteria the patient has 2 values which are elevated diagnosing her with diabetes.  She previously had an A1c in April which was 6.0, giving of the previous diagnosis of prediabetes.  Given this and her gestational age referred to OB/GYN.  Number was given.  Prescription was given for glucometer, lancets, to strips.  We discussed frequency of testing and writing down log.  She will bring to follow-up visit.  Discussed basic dietary recommendations which I will continue to discuss at her follow-up appointment  Terisa Starr, MD  [facility] Medicine Teaching Service

## 2020-11-17 ENCOUNTER — Encounter: Payer: Self-pay | Admitting: Family Medicine

## 2020-11-18 ENCOUNTER — Encounter: Payer: Self-pay | Admitting: Family Medicine

## 2020-11-18 ENCOUNTER — Telehealth: Payer: Self-pay | Admitting: Family Medicine

## 2020-11-18 NOTE — Telephone Encounter (Signed)
Messaged patient to check with insurer if different glucometer was preferred--per Dr. Raymondo Band Accu Chek guide is preferred (which is what was prescribed).  Terisa Starr, MD  Family Medicine Teaching Service

## 2020-11-22 ENCOUNTER — Other Ambulatory Visit: Payer: Self-pay

## 2020-11-22 DIAGNOSIS — Z369 Encounter for antenatal screening, unspecified: Secondary | ICD-10-CM

## 2020-11-22 LAB — GLUCOSE, POCT (MANUAL RESULT ENTRY): POC Glucose: 126 mg/dl — AB (ref 70–99)

## 2020-11-22 LAB — POCT HEMOGLOBIN

## 2020-11-28 ENCOUNTER — Ambulatory Visit (INDEPENDENT_AMBULATORY_CARE_PROVIDER_SITE_OTHER): Payer: Medicaid Other | Admitting: Family Medicine

## 2020-11-28 ENCOUNTER — Encounter: Payer: Self-pay | Admitting: Family Medicine

## 2020-11-28 DIAGNOSIS — O0992 Supervision of high risk pregnancy, unspecified, second trimester: Secondary | ICD-10-CM | POA: Insufficient documentation

## 2020-11-28 DIAGNOSIS — O24912 Unspecified diabetes mellitus in pregnancy, second trimester: Secondary | ICD-10-CM

## 2020-11-28 DIAGNOSIS — Z3480 Encounter for supervision of other normal pregnancy, unspecified trimester: Secondary | ICD-10-CM | POA: Insufficient documentation

## 2020-11-28 DIAGNOSIS — O24919 Unspecified diabetes mellitus in pregnancy, unspecified trimester: Secondary | ICD-10-CM | POA: Insufficient documentation

## 2020-11-28 NOTE — Progress Notes (Signed)
  Patient Name: [name] Date of Birth: May 20, 2001 [facility] Prenatal Visit- VIRTUAL   Patient consented to have virtual visit and was identified by name and date of birth. Method of visit: Video was attempted but was interrupted: <50% of visit completed via video  Encounter participants: Patient: Jacqueline Yu - located at home  Provider: Westley Chandler - located at home office Others (if applicable): NA   Jacqueline Yu is a 20 y.o. G2P0010 at [redacted]w[redacted]d presenting via virtual visit.  She is dated by LMP.  She reports no complaints.  She denies vaginal bleeding. She reports she does not like to prick finger for BG. Otherwise, doing well.   Dating The patient is dated by her LMP currently. This was reported to be typical cycle, however this was <3 months from an SAB. Initial ultrasound showed a fetal HR of 85 and MSD but CRL below range. She has an anatomy scan ordered but not scheduled.  Diabetes in Pregnancy The patient has an early glucose tolerance test which had 2 values above cut off. She has been making dietary changes. She dislikes pricking her finger and sight of blood. She check BG Sat/Sun. Values in AM are all below 95. Postprandial of 139, 83, 86. She  Has been referred to [facility] for remainder of care given this complication. She has eliminates some sugars but does drink sweet tea, milk, juice.   Weight this AM on home scale of 215   A/P: Pregnancy at [redacted]w[redacted]d.  Doing well.    1. Routine Prenatal Care:  Marland Kitchen Dating reviewed, dating tab is potentially inaccurate. Initial ultrasound only reports MSD and HR below 100, which optimally would have been repeated. Ultrasound ordered, will message team to schedule. Marland Kitchen Spent >8 minutes discussing flu and COVID vaccine, thinking about these.  . Anatomy ultrasound ordered to be scheduled at 18-20 weeks. Isla Pence screen normal  . Pregnancy education including expected weight gain in pregnancy, OTC medication use, continued use of prenatal  vitamin, smoking cessation if applicable, and nutrition in pregnancy.   . Bleeding and pain precautions reviewed. . Other items- also appears to need remainder of genetic/infectious screening completed.   2. Pregnancy issues include the following and were addressed as appropriate today:  . Diabetes in pregnancy, likely component of preexisting, referred to [facility]. Appointment scheduled for transfer of care.  . Problem list  and pregnancy box updated: Yes.   Follow up 4 weeks.  Total time 8 minutes.   Terisa Starr, MD  Family Medicine Teaching Service

## 2020-11-30 ENCOUNTER — Encounter: Payer: Self-pay | Admitting: Family Medicine

## 2020-12-01 ENCOUNTER — Encounter (HOSPITAL_COMMUNITY): Payer: Self-pay | Admitting: Obstetrics and Gynecology

## 2020-12-01 ENCOUNTER — Inpatient Hospital Stay (HOSPITAL_COMMUNITY)
Admission: AD | Admit: 2020-12-01 | Discharge: 2020-12-01 | Disposition: A | Discharge status: Home / Self Care | Payer: Medicaid Other | Attending: Obstetrics and Gynecology | Admitting: Obstetrics and Gynecology

## 2020-12-01 ENCOUNTER — Other Ambulatory Visit: Payer: Self-pay

## 2020-12-01 DIAGNOSIS — R42 Dizziness and giddiness: Secondary | ICD-10-CM

## 2020-12-01 DIAGNOSIS — R7989 Other specified abnormal findings of blood chemistry: Secondary | ICD-10-CM | POA: Insufficient documentation

## 2020-12-01 DIAGNOSIS — Z3A19 19 weeks gestation of pregnancy: Secondary | ICD-10-CM | POA: Insufficient documentation

## 2020-12-01 DIAGNOSIS — O26892 Other specified pregnancy related conditions, second trimester: Secondary | ICD-10-CM | POA: Diagnosis not present

## 2020-12-01 HISTORY — DX: Gestational diabetes mellitus in pregnancy, unspecified control: O24.419

## 2020-12-01 LAB — CBC
HCT: 35.5 % — ABNORMAL LOW (ref 36.0–46.0)
Hemoglobin: 12.2 g/dL (ref 12.0–15.0)
MCH: 29 pg (ref 26.0–34.0)
MCHC: 34.4 g/dL (ref 30.0–36.0)
MCV: 84.3 fL (ref 80.0–100.0)
Platelets: 363 10*3/uL (ref 150–400)
RBC: 4.21 MIL/uL (ref 3.87–5.11)
RDW: 14 % (ref 11.5–15.5)
WBC: 6.4 10*3/uL (ref 4.0–10.5)
nRBC: 0 % (ref 0.0–0.2)

## 2020-12-01 LAB — COMPREHENSIVE METABOLIC PANEL
ALT: 78 U/L — ABNORMAL HIGH (ref 0–44)
AST: 43 U/L — ABNORMAL HIGH (ref 15–41)
Albumin: 3.3 g/dL — ABNORMAL LOW (ref 3.5–5.0)
Alkaline Phosphatase: 76 U/L (ref 38–126)
Anion gap: 10 (ref 5–15)
BUN: 6 mg/dL (ref 6–20)
CO2: 22 mmol/L (ref 22–32)
Calcium: 9.8 mg/dL (ref 8.9–10.3)
Chloride: 101 mmol/L (ref 98–111)
Creatinine, Ser: 0.55 mg/dL (ref 0.44–1.00)
GFR, Estimated: >60 mL/min (ref >60)
Glucose, Bld: 100 mg/dL — ABNORMAL HIGH (ref 70–99)
Potassium: 3.7 mmol/L (ref 3.5–5.1)
Sodium: 133 mmol/L — ABNORMAL LOW (ref 135–145)
Total Bilirubin: 0.5 mg/dL (ref 0.3–1.2)
Total Protein: 7.1 g/dL (ref 6.5–8.1)

## 2020-12-01 LAB — URINALYSIS, ROUTINE W REFLEX MICROSCOPIC
Bilirubin Urine: NEGATIVE
Glucose, UA: NEGATIVE mg/dL
Hgb urine dipstick: NEGATIVE
Ketones, ur: 80 mg/dL — AB
Leukocytes,Ua: NEGATIVE
Nitrite: NEGATIVE
Protein, ur: 30 mg/dL — AB
Specific Gravity, Urine: 1.025 (ref 1.005–1.030)
pH: 5 (ref 5.0–8.0)

## 2020-12-01 NOTE — MAU Note (Signed)
Pt stated she had some abd pain last night. Sent home form work . Ate something and went to bed. This morning abd pain is gone but she has felt dizzy most of the day. Has eaten and drinking regularly. Denies any pain at this time.

## 2020-12-01 NOTE — MAU Provider Note (Signed)
History     [id number]  Arrival date and time: 12/01/20 1513    Chief Complaint  Patient presents with  . Dizziness     HPI Jacqueline Yu is a 20 y.o. at [redacted]w[redacted]d by 6wk [place], who presents for dizziness.   Patient presenting for intermittent episodes of dizziness and wants to know if everything is OK. Not currently dizzy. Some mild associated nausea with episodes of dizziness but none currently. Recently diagnosed with GDM, not on medications, sugars have ranged 70's-130's, no lows. Has not had any problem taking liquid or solid PO, thinks she is well hydrated. No problem with nausea. No associated chest pain, shortness of breath, cough, or congestion with dizziness. No abdominal pain, vaginal bleeding, or loss of fluid. Some normal vaginal discharge.    O/Positive/-- (10/05 0927)  OB History    Gravida  2   Para  0   Term  0   Preterm  0   AB  1   Living  0     SAB  1   IAB      Ectopic      Multiple      Live Births              Past Medical History:  Diagnosis Date  . Gestational diabetes   . Medical history non-contributory   . Obesity     Past Surgical History:  Procedure Laterality Date  . NO PAST SURGERIES      Family History  Problem Relation Age of Onset  . Hypertension Mother   . Hyperlipidemia Mother   . Migraines Mother   . Anxiety disorder Mother   . Schizophrenia Brother   . Depression Brother   . Anxiety disorder Brother   . Schizophrenia Maternal Grandmother   . Depression Maternal Grandmother   . Anxiety disorder Maternal Grandmother     Social History   Socioeconomic History  . Marital status: Single    Spouse name: Not on file  . Number of children: Not on file  . Years of education: Not on file  . Highest education level: Not on file  Occupational History  . Not on file  Tobacco Use  . Smoking status: Never Smoker  . Smokeless tobacco: Never Used  Vaping Use  . Vaping Use: Never used  Substance and Sexual  Activity  . Alcohol use: Not Currently  . Drug use: Not Currently    Types: Marijuana  . Sexual activity: Yes    Partners: Male    Birth control/protection: None  Other Topics Concern  . Not on file  Social History Narrative   Currently getting criminal justice degree    Boyfriend is named Dispensing optician    Social Determinants of Radio broadcast assistant Strain: Not on [employer] Insecurity: Not on file  Transportation Needs: Not on file  Physical Activity: Not on file  Stress: Not on file  Social Connections: Not on file  Intimate Partner Violence: Not on file    Allergies  Allergen Reactions  . Peanuts [Peanut Oil]     Scratchy throat    No current facility-administered medications on file prior to encounter.   Current Outpatient Medications on File Prior to Encounter  Medication Sig Dispense Refill  . Prenatal Vit-Fe Fumarate-FA (MULTIVITAMIN-PRENATAL) 27-0.8 MG TABS tablet Take 1 tablet by mouth daily at 12 noon.    . Accu-Chek Softclix Lancets lancets Use as instructed 100 each 12  . Blood Glucose Monitoring Suppl (ACCU-CHEK  GUIDE) w/Device KIT Use to check blood glucose four times daily, keep log of values 1 kit 0  . glucose blood test strip Use as instructed 100 each 12  . promethazine (PHENERGAN) 12.5 MG tablet Take 1 tablet (12.5 mg total) by mouth every 6 (six) hours as needed for nausea or vomiting. 30 tablet 1     ROS Pertinent positives and negative per HPI, all others reviewed and negative  Physical Exam   BP 139/77   Pulse 96   Temp 98.8 F (37.1 C)   Resp 18   Ht $R'5\' 4"'Up$  (1.626 m)   Wt 97.1 kg   LMP 07/15/2020   BMI 36.73 kg/m   Patient Vitals for the past 24 hrs:  BP Temp Pulse Resp Height Weight  12/01/20 1527 139/77 98.8 F (37.1 C) 96 18 $Rem'5\' 4"'HenU$  (1.626 m) 97.1 kg    Physical Exam Vitals reviewed.  Constitutional:      General: She is not in acute distress.    Appearance: She is well-developed and well-nourished. She is not diaphoretic.   Eyes:     General: No scleral icterus. Cardiovascular:     Rate and Rhythm: Normal rate and regular rhythm.     Heart sounds: No murmur heard.   Pulmonary:     Effort: Pulmonary effort is normal. No respiratory distress.     Breath sounds: Normal breath sounds.  Abdominal:     General: There is no distension.     Palpations: Abdomen is soft.     Tenderness: There is no abdominal tenderness. There is no guarding or rebound.  Musculoskeletal:        General: No edema.  Skin:    General: Skin is warm and dry.  Neurological:     Mental Status: She is alert.     Coordination: Coordination normal.  Psychiatric:        Mood and Affect: Mood and affect normal.      Cervical Exam    Bedside Ultrasound Not done  My interpretation: n/a  FHT FHR 147  Labs Results for orders placed or performed during the hospital encounter of 12/01/20 (from the past 24 hour(s))  CBC     Status: Abnormal   Collection Time: 12/01/20  3:45 PM  Result Value Ref Range   WBC 6.4 4.0 - 10.5 K/uL   RBC 4.21 3.87 - 5.11 MIL/uL   Hemoglobin 12.2 12.0 - 15.0 g/dL   HCT 35.5 (L) 36.0 - 46.0 %   MCV 84.3 80.0 - 100.0 fL   MCH 29.0 26.0 - 34.0 pg   MCHC 34.4 30.0 - 36.0 g/dL   RDW 14.0 11.5 - 15.5 %   Platelets 363 150 - 400 K/uL   nRBC 0.0 0.0 - 0.2 %  Urinalysis, Routine w reflex microscopic Urine, Clean Catch     Status: Abnormal   Collection Time: 12/01/20  3:49 PM  Result Value Ref Range   Color, Urine YELLOW YELLOW   APPearance HAZY (A) CLEAR   Specific Gravity, Urine 1.025 1.005 - 1.030   pH 5.0 5.0 - 8.0   Glucose, UA NEGATIVE NEGATIVE mg/dL   Hgb urine dipstick NEGATIVE NEGATIVE   Bilirubin Urine NEGATIVE NEGATIVE   Ketones, ur 80 (A) NEGATIVE mg/dL   Protein, ur 30 (A) NEGATIVE mg/dL   Nitrite NEGATIVE NEGATIVE   Leukocytes,Ua NEGATIVE NEGATIVE   RBC / HPF 0-5 0 - 5 RBC/hpf   WBC, UA 11-20 0 - 5 WBC/hpf   Bacteria, UA  RARE (A) NONE SEEN   Squamous Epithelial / LPF 11-20 0 - 5    Mucus PRESENT     Imaging No results found.  MAU Course  Procedures  Lab Orders     Urinalysis, Routine w reflex microscopic Urine, Clean Catch     CBC     Comprehensive metabolic panel No orders of the defined types were placed in this encounter.  Imaging Orders  No imaging studies ordered today    MDM mild  Assessment and Plan  #Dizziness Patient currently well appearing and not dizzy. History and physical exam both reassuring, unclear etiology but no red flags and I suspect related to hypovolemia. Basic labs obtained, offered discharge to patient with phone follow up of results, she is amenable with this plan.   #FWB FHR 147 bpm by doppler  Discharged to home in stable condition.   Clarnce Flock, MD/MPH 12/01/20 6:08 PM    Addendum: Patients CBC unremarkable, CMP with unexpected findings of mildly elevated LFTs. Called patient, possibly due to dehydration but could also be COVID, recommended she get tested ASAP. Message sent to PCP to recheck CMP and viral serologies at upcoming visit.   Clarnce Flock, MD/MPH 6:26 PM    Allergies as of 12/01/2020      Reactions   Peanuts [peanut Oil]    Scratchy throat      Medication List    TAKE these medications   Accu-Chek Guide w/Device Kit Use to check blood glucose four times daily, keep log of values   Accu-Chek Softclix Lancets lancets Use as instructed   glucose blood test strip Use as instructed   multivitamin-prenatal 27-0.8 MG Tabs tablet Take 1 tablet by mouth daily at 12 noon.   promethazine 12.5 MG tablet Commonly known as: PHENERGAN Take 1 tablet (12.5 mg total) by mouth every 6 (six) hours as needed for nausea or vomiting.

## 2020-12-02 ENCOUNTER — Telehealth: Payer: Self-pay

## 2020-12-02 NOTE — Telephone Encounter (Signed)
Attempted to call patient with information concerning upcoming appointment.  [facility] [address] [place] [address] [phone]  Ultrasound Complete 12/09/2020 1345 with arrival at 1330  There was no answer and no ability to leave a message.  Will try again later.  Glennie Hawk, CMA

## 2020-12-09 ENCOUNTER — Other Ambulatory Visit: Payer: Self-pay | Admitting: Family Medicine

## 2020-12-09 ENCOUNTER — Other Ambulatory Visit: Payer: Self-pay

## 2020-12-09 ENCOUNTER — Ambulatory Visit: Payer: Medicaid Other | Attending: Family Medicine

## 2020-12-09 DIAGNOSIS — O43192 Other malformation of placenta, second trimester: Secondary | ICD-10-CM | POA: Diagnosis not present

## 2020-12-09 DIAGNOSIS — Z3A20 20 weeks gestation of pregnancy: Secondary | ICD-10-CM

## 2020-12-09 DIAGNOSIS — E669 Obesity, unspecified: Secondary | ICD-10-CM | POA: Insufficient documentation

## 2020-12-09 DIAGNOSIS — Z6836 Body mass index (BMI) 36.0-36.9, adult: Secondary | ICD-10-CM

## 2020-12-09 DIAGNOSIS — O24419 Gestational diabetes mellitus in pregnancy, unspecified control: Secondary | ICD-10-CM | POA: Diagnosis not present

## 2020-12-09 DIAGNOSIS — O24410 Gestational diabetes mellitus in pregnancy, diet controlled: Secondary | ICD-10-CM | POA: Diagnosis not present

## 2020-12-09 DIAGNOSIS — O99212 Obesity complicating pregnancy, second trimester: Secondary | ICD-10-CM | POA: Diagnosis not present

## 2020-12-09 DIAGNOSIS — Z363 Encounter for antenatal screening for malformations: Secondary | ICD-10-CM | POA: Insufficient documentation

## 2020-12-11 ENCOUNTER — Encounter: Payer: Self-pay | Admitting: Family Medicine

## 2020-12-11 DIAGNOSIS — O43199 Other malformation of placenta, unspecified trimester: Secondary | ICD-10-CM | POA: Insufficient documentation

## 2020-12-11 DIAGNOSIS — IMO0002 Reserved for concepts with insufficient information to code with codable children: Secondary | ICD-10-CM | POA: Insufficient documentation

## 2020-12-11 DIAGNOSIS — O358XX0 Maternal care for other (suspected) fetal abnormality and damage, not applicable or unspecified: Secondary | ICD-10-CM | POA: Insufficient documentation

## 2020-12-12 ENCOUNTER — Other Ambulatory Visit: Payer: Self-pay | Admitting: *Deleted

## 2020-12-12 ENCOUNTER — Other Ambulatory Visit: Payer: Self-pay | Admitting: Obstetrics

## 2020-12-12 DIAGNOSIS — O283 Abnormal ultrasonic finding on antenatal screening of mother: Secondary | ICD-10-CM

## 2020-12-12 DIAGNOSIS — R7989 Other specified abnormal findings of blood chemistry: Secondary | ICD-10-CM

## 2020-12-13 ENCOUNTER — Telehealth: Payer: Self-pay

## 2020-12-13 NOTE — Telephone Encounter (Signed)
-----   Message from Brock Bad, MD sent at 12/12/2020  7:50 AM EST ----- Patient has elevated LFT's.  Please call her for labs to be drawn.

## 2020-12-13 NOTE — Telephone Encounter (Signed)
TC to pt to make aware will need to draw labs at appt on Friday pt not ava not able to leave a vm.

## 2020-12-15 ENCOUNTER — Inpatient Hospital Stay (HOSPITAL_COMMUNITY)
Admission: AD | Admit: 2020-12-15 | Discharge: 2020-12-15 | Disposition: A | Discharge status: Home / Self Care | Payer: Medicaid Other | Attending: Obstetrics & Gynecology | Admitting: Obstetrics & Gynecology

## 2020-12-15 ENCOUNTER — Encounter (HOSPITAL_COMMUNITY): Payer: Self-pay | Admitting: Obstetrics & Gynecology

## 2020-12-15 DIAGNOSIS — O26892 Other specified pregnancy related conditions, second trimester: Secondary | ICD-10-CM | POA: Insufficient documentation

## 2020-12-15 DIAGNOSIS — Z3A21 21 weeks gestation of pregnancy: Secondary | ICD-10-CM | POA: Insufficient documentation

## 2020-12-15 DIAGNOSIS — K219 Gastro-esophageal reflux disease without esophagitis: Secondary | ICD-10-CM | POA: Insufficient documentation

## 2020-12-15 DIAGNOSIS — O99612 Diseases of the digestive system complicating pregnancy, second trimester: Secondary | ICD-10-CM | POA: Diagnosis not present

## 2020-12-15 DIAGNOSIS — Z3A29 29 weeks gestation of pregnancy: Secondary | ICD-10-CM

## 2020-12-15 DIAGNOSIS — O99891 Other specified diseases and conditions complicating pregnancy: Secondary | ICD-10-CM

## 2020-12-15 DIAGNOSIS — O2343 Unspecified infection of urinary tract in pregnancy, third trimester: Secondary | ICD-10-CM

## 2020-12-15 DIAGNOSIS — Z79899 Other long term (current) drug therapy: Secondary | ICD-10-CM | POA: Diagnosis not present

## 2020-12-15 DIAGNOSIS — O2342 Unspecified infection of urinary tract in pregnancy, second trimester: Secondary | ICD-10-CM | POA: Insufficient documentation

## 2020-12-15 DIAGNOSIS — R109 Unspecified abdominal pain: Secondary | ICD-10-CM | POA: Diagnosis not present

## 2020-12-15 DIAGNOSIS — R10A1 Flank pain, right side: Secondary | ICD-10-CM | POA: Diagnosis present

## 2020-12-15 DIAGNOSIS — R1011 Right upper quadrant pain: Secondary | ICD-10-CM | POA: Diagnosis not present

## 2020-12-15 LAB — COMPREHENSIVE METABOLIC PANEL
ALT: 54 U/L — ABNORMAL HIGH (ref 0–44)
AST: 34 U/L (ref 15–41)
Albumin: 3 g/dL — ABNORMAL LOW (ref 3.5–5.0)
Alkaline Phosphatase: 84 U/L (ref 38–126)
Anion gap: 11 (ref 5–15)
BUN: <5 mg/dL — ABNORMAL LOW (ref 6–20)
CO2: 20 mmol/L — ABNORMAL LOW (ref 22–32)
Calcium: 9.2 mg/dL (ref 8.9–10.3)
Chloride: 99 mmol/L (ref 98–111)
Creatinine, Ser: 0.61 mg/dL (ref 0.44–1.00)
GFR, Estimated: >60 mL/min (ref >60)
Glucose, Bld: 89 mg/dL (ref 70–99)
Potassium: 3.3 mmol/L — ABNORMAL LOW (ref 3.5–5.1)
Sodium: 130 mmol/L — ABNORMAL LOW (ref 135–145)
Total Bilirubin: 0.8 mg/dL (ref 0.3–1.2)
Total Protein: 7 g/dL (ref 6.5–8.1)

## 2020-12-15 LAB — CBC
HCT: 34 % — ABNORMAL LOW (ref 36.0–46.0)
Hemoglobin: 11.3 g/dL — ABNORMAL LOW (ref 12.0–15.0)
MCH: 28.3 pg (ref 26.0–34.0)
MCHC: 33.2 g/dL (ref 30.0–36.0)
MCV: 85.2 fL (ref 80.0–100.0)
Platelets: 318 10*3/uL (ref 150–400)
RBC: 3.99 MIL/uL (ref 3.87–5.11)
RDW: 13.4 % (ref 11.5–15.5)
WBC: 10 10*3/uL (ref 4.0–10.5)
nRBC: 0 % (ref 0.0–0.2)

## 2020-12-15 LAB — AMYLASE: Amylase: 36 U/L (ref 28–100)

## 2020-12-15 LAB — URINALYSIS, ROUTINE W REFLEX MICROSCOPIC
Bilirubin Urine: NEGATIVE
Glucose, UA: NEGATIVE mg/dL
Hgb urine dipstick: NEGATIVE
Ketones, ur: 80 mg/dL — AB
Nitrite: POSITIVE — AB
Protein, ur: 30 mg/dL — AB
Specific Gravity, Urine: 1.021 (ref 1.005–1.030)
WBC, UA: >50 WBC/hpf — ABNORMAL HIGH (ref 0–5)
pH: 5 (ref 5.0–8.0)

## 2020-12-15 LAB — LIPASE, BLOOD: Lipase: 16 U/L (ref 11–51)

## 2020-12-15 MED ORDER — ALUM & MAG HYDROXIDE-SIMETH 200-200-20 MG/5ML PO SUSP
30.0000 mL | Freq: Once | ORAL | Status: AC
  Administered 2020-12-15: 30 mL via ORAL
  Filled 2020-12-15: qty 30

## 2020-12-15 MED ORDER — TRAMADOL HCL 50 MG PO TABS: 50.0000 mg | ORAL_TABLET | Freq: Four times a day (QID) | ORAL | 0 refills | Status: DC | PRN

## 2020-12-15 MED ORDER — LIDOCAINE VISCOUS HCL 2 % MT SOLN
15.0000 mL | Freq: Once | OROMUCOSAL | Status: AC
  Administered 2020-12-15: 15 mL via ORAL
  Filled 2020-12-15: qty 15

## 2020-12-15 MED ORDER — SODIUM CHLORIDE 0.9 % IV SOLN: 2.0000 g | Freq: Once | INTRAVENOUS | Status: DC

## 2020-12-15 MED ORDER — OXYCODONE-ACETAMINOPHEN 5-325 MG PO TABS
1.0000 | ORAL_TABLET | Freq: Once | ORAL | Status: AC
  Administered 2020-12-15: 1 via ORAL
  Filled 2020-12-15: qty 1

## 2020-12-15 MED ORDER — PANTOPRAZOLE SODIUM 20 MG PO TBEC: 20.0000 mg | DELAYED_RELEASE_TABLET | Freq: Every day | ORAL | 0 refills | Status: DC

## 2020-12-15 MED ORDER — CEFADROXIL 500 MG PO CAPS: 500.0000 mg | ORAL_CAPSULE | Freq: Two times a day (BID) | ORAL | 0 refills | Status: AC

## 2020-12-15 MED ORDER — SODIUM CHLORIDE 0.9 % IV SOLN
2.0000 g | Freq: Once | INTRAVENOUS | Status: AC
  Administered 2020-12-15: 2 g via INTRAVENOUS
  Filled 2020-12-15: qty 2

## 2020-12-15 NOTE — Discharge Instructions (Signed)
Dolor abdominal en los adultos Abdominal Pain, Adult El dolor de [place] (abdominal) puede tener muchas causas. La mayora de las veces, el dolor de [place] no es peligroso. Muchos de [employer] de dolor de estmago pueden controlarse y tratarse en casa. Sin embargo, a Occupational psychologist, Chief Technology Officer de [place] es grave. El mdico intentar descubrir la causa del dolor de Jacqueline Yu. Siga estas instrucciones en su casa: Medicamentos  [employer] de venta libre y los recetados solamente como se lo haya indicado el mdico.  No tome medicamentos que lo ayuden a Advertising copywriter (laxantes), salvo que el mdico se lo indique. Instrucciones generales  Est atento al dolor de estmago para Insurance risk surveyor cambio.  Beba suficiente lquido para Radio producer pis (la orina) de color amarillo plido.  Concurra a todas las visitas de [address] se lo haya indicado el mdico. Esto es importante.   Comunquese con un mdico si:  El dolor de [address] cambia o [place].  No tiene apetito o baja de peso sin proponrselo.  Tiene dificultades para defecar (est estreido) o heces lquidas (diarrea) durante ms de 2 o 3das.  Siente dolor al orinar o defecar.  El dolor de estmago lo despierta de noche.  El dolor empeora con las comidas, despus de comer o con determinados alimentos.  Tiene vmitos y no puede retener nada de lo que ingiere.  Tiene fiebre.  Observa sangre en la orina. Solicite ayuda de inmediato si:  El dolor no desaparece en el tiempo indicado por el mdico.  No puede dejar de vomitar.  Siente dolor solamente en zonas especficas del abdomen, como el lado derecho o la parte inferior izquierda.  Tiene heces con sangre, de color negro o con aspecto alquitranado.  Tiene dolor muy intenso en el vientre, clicos o meteorismo.  Presenta signos de no tener suficientes lquidos o agua en el cuerpo (deshidratacin), por ejemplo: ? [place], muy escasa o falta de orina. ? Labios  agrietados. ? Sequedad de boca. ? Ojos hundidos. ? Somnolencia. ? Debilidad.  Tiene dificultad para respirar o Journalist, newspaper. Resumen  Muchos de [employer] de dolor de estmago pueden controlarse y tratarse en casa.  Est atento al dolor de estmago para Insurance risk surveyor cambio.  Tome los medicamentos de venta libre y los recetados solamente como se lo haya indicado el mdico.  Comunquese con un mdico si el dolor de estmago cambia o [place].  Busque ayuda de inmediato si tiene dolor muy intenso en el vientre, clicos o meteorismo. Esta informacin no tiene Theme park manager el consejo del mdico. Asegrese de hacerle al mdico cualquier pregunta que tenga. Document Revised: 05/06/2019 Document Reviewed: 05/06/2019 Elsevier Patient Education  2021 Elsevier Inc. Pyelonephritis, Adult  Pyelonephritis is an infection that occurs in the kidney. The kidneys are organs that help clean the blood by moving waste out of the blood and into the pee (urine). This infection can happen quickly, or it can last for a long time. In most cases, it clears up with treatment and does not cause other problems. What are the causes? This condition may be caused by:  Germs (bacteria) going from the bladder up to the kidney. This may happen after having a bladder infection.  Germs going from the blood to the kidney. What increases the risk? This condition is more likely to develop in:  Pregnant women.  Older people.  People who have any of these conditions: ? Diabetes. ? Inflammation of the prostate gland (prostatitis), in males. ?  Kidney stones or bladder stones. ? Other problems with the kidney or the parts of your body that carry pee from the kidneys to the bladder (ureters). ? Cancer.  People who have a small, thin tube (catheter) placed in the bladder.  People who are sexually active.  Women who use a medicine that kills sperm (spermicide) to prevent pregnancy.  People who  have had a prior urinary tract infection (UTI). What are the signs or symptoms? Symptoms of this condition include:  Peeing often.  A strong urge to pee right away.  Burning or stinging when peeing.  Belly pain.  Back pain.  Pain in the side (flank area).  Fever or chills.  Blood in the pee, or dark pee.  Feeling sick to your stomach (nauseous) or throwing up (vomiting). How is this treated? This condition may be treated by:  Taking antibiotic medicines by mouth (orally).  Drinking enough fluids. If the infection is bad, you may need to stay in the hospital. You may be given antibiotics and fluids that are put directly into a vein through an IV tube. In some cases, other treatments may be needed. Follow these instructions at home: Medicines  Take your antibiotic medicine as told by your doctor. Do not stop taking the antibiotic even if you start to feel better.  Take over-the-counter and prescription medicines only as told by your doctor. General instructions  Drink enough fluid to keep your pee pale yellow.  Avoid caffeine, tea, and carbonated drinks.  Pee (urinate) often. Avoid holding in pee for long periods of time.  Pee before and after sex.  After pooping (having a bowel movement), women should wipe from front to back. Use each tissue only once.  Keep all follow-up visits as told by your doctor. This is important.   Contact a doctor if:  You do not feel better after 2 days.  Your symptoms get worse.  You have a fever. Get help right away if:  You cannot take your medicine or drink fluids as told.  You have chills and shaking.  You throw up.  You have very bad pain in your side or back.  You feel very weak or you pass out (faint). Summary  Pyelonephritis is an infection that occurs in the kidney.  In most cases, this infection clears up with treatment and does not cause other problems.  Take your antibiotic medicine as told by your doctor.  Do not stop taking the antibiotic even if you start to feel better.  Drink enough fluid to keep your pee pale yellow. This information is not intended to replace advice given to you by your health care provider. Make sure you discuss any questions you have with your health care provider. Document Revised: 09/02/2018 Document Reviewed: 09/02/2018 Elsevier Patient Education  2021 [employer].

## 2020-12-15 NOTE — Progress Notes (Signed)
Pt states pain on R side is worse with movement. GI cocktail did help reflux discomfort

## 2020-12-15 NOTE — MAU Provider Note (Signed)
Chief Complaint:  No chief complaint on file.   Event Date/Time   First Provider Initiated Contact with Patient 12/15/20 0156     HPI: Jacqueline Yu is a 20 y.o. G2P0010 at 37w6dwho presents to maternity admissions reporting pain in right side/flank for 2 days.  Has some burning epigastric pain with reflux, and sometimes some RUQ pain. Eating makes it worse, ate pizza today.  . She reports good fetal movement, denies LOF, vaginal bleeding, vaginal itching/burning, urinary symptoms, h/a, dizziness, n/v, diarrhea, constipation or fever/chills.   Abdominal Pain This is a new problem. The current episode started in the past 7 days. The problem occurs constantly. The problem has been unchanged. The pain is located in the RUQ and right flank. The abdominal pain radiates to the back. Pertinent negatives include no constipation, diarrhea, dysuria, fever, frequency, myalgias, nausea or vomiting. The pain is aggravated by eating. The pain is relieved by nothing. She has tried nothing for the symptoms.    RN Note: Pain in R side for 2 days. Pain is sharp, achy, crampy and constant. Eating makes pain worse and sometimes I have a lot of reflux. Denies VB or LOF.   Past Medical History: Past Medical History:  Diagnosis Date  . Gestational diabetes   . Medical history non-contributory   . Obesity     Past obstetric history: OB History  Gravida Para Term Preterm AB Living  2 0 0 0 1 0  SAB IAB Ectopic Multiple Live Births  1            # Outcome Date GA Lbr Len/2nd Weight Sex Delivery Anes PTL Lv  2 Current           1 SAB 06/15/20 [redacted]w[redacted]d    SAB       Past Surgical History: Past Surgical History:  Procedure Laterality Date  . NO PAST SURGERIES      Family History: Family History  Problem Relation Age of Onset  . Hypertension Mother   . Hyperlipidemia Mother   . Migraines Mother   . Anxiety disorder Mother   . Schizophrenia Brother   . Depression Brother   . Anxiety disorder Brother   .  Schizophrenia Maternal Grandmother   . Depression Maternal Grandmother   . Anxiety disorder Maternal Grandmother     Social History: Social History   Tobacco Use  . Smoking status: Never Smoker  . Smokeless tobacco: Never Used  Vaping Use  . Vaping Use: Never used  Substance Use Topics  . Alcohol use: Not Currently  . Drug use: Not Currently    Types: Marijuana    Allergies:  Allergies  Allergen Reactions  . Peanuts [Peanut Oil]     Scratchy throat    Meds:  Medications Prior to Admission  Medication Sig Dispense Refill Last Dose  . Accu-Chek Softclix Lancets lancets Use as instructed 100 each 12   . Blood Glucose Monitoring Suppl (ACCU-CHEK GUIDE) w/Device KIT Use to check blood glucose four times daily, keep log of values 1 kit 0   . glucose blood test strip Use as instructed 100 each 12   . Prenatal Vit-Fe Fumarate-FA (MULTIVITAMIN-PRENATAL) 27-0.8 MG TABS tablet Take 1 tablet by mouth daily at 12 noon.     . promethazine (PHENERGAN) 12.5 MG tablet Take 1 tablet (12.5 mg total) by mouth every 6 (six) hours as needed for nausea or vomiting. 30 tablet 1     I have reviewed patient's Past Medical Hx, Surgical Hx, Family  Hx, Social Hx, medications and allergies.   ROS:  Review of Systems  Constitutional: Negative for fever.  Gastrointestinal: Positive for abdominal pain. Negative for constipation, diarrhea, nausea and vomiting.  Genitourinary: Negative for dysuria and frequency.  Musculoskeletal: Negative for myalgias.   Other systems negative  Physical Exam  No data found. Constitutional: Well-developed, well-nourished female in no acute distress.  Cardiovascular: normal rate and rhythm Respiratory: normal effort, clear to auscultation bilaterally GI: Abd soft, tender over RUQ and right flank, gravid appropriate for gestational age.   No rebound or guarding. MS: Extremities nontender, no edema, normal ROM Neurologic: Alert and oriented x 4.  GU: Neg  CVAT.  PELVIC EXAM: deferred   FHT:  147   Labs:  O/Positive/-- (10/05 5183)  Imaging:  No results found.   MAU Course/MDM: I have ordered labs and reviewed results.  Urine shows signs of infection.  Given pattern of pain, I think some of it reflects a pyelonephritis.  I do suspect she has some biliary disease, but pain is not severe and there are no compelling signs of cholecystitis right now.  WIll plan outpatient ultrasound.   NST reviewed, reassuring  Will give a dose of Rocephin 2gm IV tonight then rx Duricef for home use  Treatments in MAU included IV antibiotic, analgesic.   Has appt tomorrow.     Assessment: [place] IUP at [redacted]w[redacted]d Right upper abdominal pain Right flank pain UTI, suspect early pyelonephritis Possible cholelithiasis  Plan: Discharge home Ordered outpatient RUQ ultrasound to rule out gallstones Rocephin 2gm IV given Rx sent for [facility] for UTI Urine to culture Preterm Labor precautions and fetal kick counts Follow up in Office for prenatal visits   Encouraged to return if she develops worsening of symptoms, increase in pain, fever, or other concerning symptoms.  Pt stable at time of discharge.  Hansel Feinstein CNM, MSN Certified Nurse-Midwife 12/15/2020 1:57 AM

## 2020-12-15 NOTE — MAU Note (Signed)
Pain in R side for 2 days. Pain is sharp, achy, crampy and constant. Eating makes pain worse and sometimes I have a lot of reflux. Denies VB or LOF.

## 2020-12-16 ENCOUNTER — Encounter: Payer: Self-pay | Admitting: Obstetrics

## 2020-12-16 ENCOUNTER — Ambulatory Visit (INDEPENDENT_AMBULATORY_CARE_PROVIDER_SITE_OTHER): Payer: Medicaid Other | Admitting: Obstetrics

## 2020-12-16 ENCOUNTER — Other Ambulatory Visit: Payer: Self-pay

## 2020-12-16 VITALS — BP 133/84 | HR 91 | Wt 208.7 lb

## 2020-12-16 DIAGNOSIS — Z3482 Encounter for supervision of other normal pregnancy, second trimester: Secondary | ICD-10-CM | POA: Diagnosis not present

## 2020-12-16 DIAGNOSIS — Z3A22 22 weeks gestation of pregnancy: Secondary | ICD-10-CM

## 2020-12-16 DIAGNOSIS — O0990 Supervision of high risk pregnancy, unspecified, unspecified trimester: Secondary | ICD-10-CM

## 2020-12-16 DIAGNOSIS — K219 Gastro-esophageal reflux disease without esophagitis: Secondary | ICD-10-CM

## 2020-12-16 DIAGNOSIS — E669 Obesity, unspecified: Secondary | ICD-10-CM

## 2020-12-16 DIAGNOSIS — O24410 Gestational diabetes mellitus in pregnancy, diet controlled: Secondary | ICD-10-CM

## 2020-12-16 MED ORDER — ASPIRIN 81 MG PO CHEW
81.0000 mg | CHEWABLE_TABLET | Freq: Every day | ORAL | 5 refills | Status: DC
Start: 2020-12-16 — End: 2021-02-23

## 2020-12-16 MED ORDER — GOJJI WEIGHT SCALE MISC: 1.0000 | 0 refills | Status: AC | PRN

## 2020-12-16 MED ORDER — BLOOD PRESSURE KIT DEVI: 0 refills | Status: AC

## 2020-12-16 NOTE — Progress Notes (Signed)
NOB transferred from [facility] d/t recent diagnosis of GDM  This is a planned pregnancy and FOB is involved. ASCUS HPV HR pap completed on 09/30/2020 Pt currently taking ABx for UTI  PHQ9= 3

## 2020-12-16 NOTE — Progress Notes (Signed)
Subjective:    Jacqueline Yu is being seen today for her first obstetrical visit.  This is a planned pregnancy. She is at [redacted]w[redacted]d gestation. Her obstetrical history is significant for none. Relationship with FOB: significant other, living together. Patient does intend to breast feed. Pregnancy history fully reviewed.  The information documented in the HPI was reviewed and verified.  Menstrual History: OB History    Gravida  2   Para  0   Term  0   Preterm  0   AB  1   Living  0     SAB  1   IAB      Ectopic      Multiple      Live Births               Patient's last menstrual period was 07/15/2020.    Past Medical History:  Diagnosis Date  . Gestational diabetes   . Medical history non-contributory   . Obesity     Past Surgical History:  Procedure Laterality Date  . NO PAST SURGERIES      (Not in a hospital admission)  Allergies  Allergen Reactions  . Peanuts [Peanut Oil]     Scratchy throat    Social History   Tobacco Use  . Smoking status: Never Smoker  . Smokeless tobacco: Never Used  Substance Use Topics  . Alcohol use: Not Currently    Family History  Problem Relation Age of Onset  . Hypertension Mother   . Hyperlipidemia Mother   . Migraines Mother   . Anxiety disorder Mother   . Schizophrenia Brother   . Depression Brother   . Anxiety disorder Brother   . Schizophrenia Maternal Grandmother   . Depression Maternal Grandmother   . Anxiety disorder Maternal Grandmother      Review of Systems Constitutional: negative for weight loss Gastrointestinal: negative for vomiting Genitourinary:negative for genital lesions and vaginal discharge and dysuria Musculoskeletal:negative for back pain Behavioral/Psych: negative for abusive relationship, depression, illegal drug usage and tobacco use    Objective:    BP 133/84   Pulse 91   Wt 208 lb 11.2 oz (94.7 kg)   LMP 07/15/2020   BMI 35.82 kg/m  General Appearance:    Alert,  cooperative, no distress, appears stated age  Head:    Normocephalic, without obvious abnormality, atraumatic  Eyes:    PERRL, conjunctiva/corneas clear, EOM's intact, fundi    benign, both eyes  Ears:    Normal TM's and external ear canals, both ears  Nose:   Nares normal, septum midline, mucosa normal, no drainage    or sinus tenderness  Throat:   Lips, mucosa, and tongue normal; teeth and gums normal  Neck:   Supple, symmetrical, trachea midline, no adenopathy;    thyroid:  no enlargement/tenderness/nodules; no carotid   bruit or JVD  Back:     Symmetric, no curvature, ROM normal, no CVA tenderness  Lungs:     Clear to auscultation bilaterally, respirations unlabored  Chest Wall:    No tenderness or deformity   Heart:    Regular rate and rhythm, S1 and S2 normal, no murmur, rub   or gallop  Breast Exam:    No tenderness, masses, or nipple abnormality  Abdomen:     Soft, non-tender, bowel sounds active all four quadrants,    no masses, no organomegaly  Genitalia:    Normal female without lesion, discharge or tenderness  Extremities:   Extremities normal, atraumatic, no  cyanosis or edema  Pulses:   2+ and symmetric all extremities  Skin:   Skin color, texture, turgor normal, no rashes or lesions  Lymph nodes:   Cervical, supraclavicular, and axillary nodes normal  Neurologic:   CNII-XII intact, normal strength, sensation and reflexes    throughout      Lab Review Urine pregnancy test Labs reviewed yes Radiologic studies reviewed yes  Assessment:    Pregnancy at [redacted]w[redacted]d weeks    Plan:     1. Supervision of high risk pregnancy, antepartum  2. Diet controlled White classification A1 gestational diabetes mellitus (GDM) - newly diagnosed.  Not checking glucose yet Rx: - Referral to Nutrition and Diabetes Services  3. Obesity (BMI 35.0-39.9 without comorbidity) Rx: - aspirin 81 MG chewable tablet; Chew 1 tablet (81 mg total) by mouth daily.  Dispense: 30 tablet; Refill: 5  4.  GERD without esophagitis - taking Protonix  Prenatal vitamins.  Counseling provided regarding continued use of seat belts, cessation of alcohol consumption, smoking or use of illicit drugs; infection precautions i.e., influenza/TDAP immunizations, toxoplasmosis,CMV, parvovirus, listeria and varicella; workplace safety, exercise during pregnancy; routine dental care, safe medications, sexual activity, hot tubs, saunas, pools, travel, caffeine use, fish and methlymercury, potential toxins, hair treatments, varicose veins Weight gain recommendations per IOM guidelines reviewed: underweight/BMI< 18.5--> gain 28 - 40 lbs; normal weight/BMI 18.5 - 24.9--> gain 25 - 35 lbs; overweight/BMI 25 - 29.9--> gain 15 - 25 lbs; obese/BMI >30->gain  11 - 20 lbs Problem list reviewed and updated. FIRST/CF mutation testing/NIPT/QUAD SCREEN/fragile X/Ashkenazi Jewish population testing/Spinal muscular atrophy discussed: requested. Role of ultrasound in pregnancy discussed; fetal survey: requested. Amniocentesis discussed: not indicated.  Meds ordered this encounter  Medications  . Misc. Devices (GOJJI WEIGHT SCALE) MISC    Sig: 1 Device by Does not apply route as needed.    Dispense:  1 each    Refill:  0  . Blood Pressure Monitoring (BLOOD PRESSURE KIT) DEVI    Sig: Check blood pressure readings at home regularly o09.92    Dispense:  1 each    Refill:  0   Orders Placed This Encounter  Procedures  . Referral to Nutrition and Diabetes Services    Referral Priority:   Routine    Referral Type:   Consultation    Referral Reason:   Specialty Services Required    Number of Visits Requested:   1    Follow up in 2 weeks. 50% of 20 min visit spent on counseling and coordination of care.    Shelly Bombard, MD 12/16/2020 9:34 AM

## 2020-12-17 LAB — CULTURE, OB URINE: Culture: >=100000 — AB

## 2020-12-19 ENCOUNTER — Telehealth: Payer: Self-pay

## 2020-12-19 ENCOUNTER — Encounter: Payer: Self-pay | Admitting: Family Medicine

## 2020-12-19 ENCOUNTER — Telehealth (INDEPENDENT_AMBULATORY_CARE_PROVIDER_SITE_OTHER): Payer: Medicaid Other | Admitting: Family Medicine

## 2020-12-19 ENCOUNTER — Other Ambulatory Visit: Payer: Self-pay

## 2020-12-19 DIAGNOSIS — R7401 Elevation of levels of liver transaminase levels: Secondary | ICD-10-CM

## 2020-12-19 DIAGNOSIS — O24912 Unspecified diabetes mellitus in pregnancy, second trimester: Secondary | ICD-10-CM

## 2020-12-19 DIAGNOSIS — R109 Unspecified abdominal pain: Secondary | ICD-10-CM

## 2020-12-19 DIAGNOSIS — O0992 Supervision of high risk pregnancy, unspecified, second trimester: Secondary | ICD-10-CM

## 2020-12-19 DIAGNOSIS — O09892 Supervision of other high risk pregnancies, second trimester: Secondary | ICD-10-CM

## 2020-12-19 NOTE — Assessment & Plan Note (Signed)
Fasting blood sugar and postprandials are currently at goal.  She will need routine fetal monitoring bleeding in the third trimester.  She is following with high risk OB for her pregnancy and has follow-up with them for the remainder of her prenatal care.

## 2020-12-19 NOTE — Telephone Encounter (Signed)
Patient calls nurse line requesting information on how to get covid tested. Patient had a virtual this with PCP and at the time did not have transportation. Patient has transportation now. I advised patient of several locations she get Covid tested and to call around to pharmacies.

## 2020-12-19 NOTE — Progress Notes (Signed)
[facility] Telemedicine Visit  Patient consented to have virtual visit and was identified by name and date of birth. Method of visit: Video was attempted but was interrupted: <50% of visit completed via video  Encounter participants: Patient: Jacqueline Yu - located at home no video Provider: Westley Chandler - located at office Others (if applicable): none   Chief Complaint: congestion   HPI:  Jacqueline Yu is a very pleasant 20 year old G2 P0-0-1-0 at 22 weeks and 3 days as dated by last menstrual period.    This was to be an in person assessment today for follow-up for elevated ALT/AST.  Over the weekend the patient developed congestion drainage and a sinus headache. She denies fevers or chills.  She is not vaccinated against Covid.  She denies Covid exposure.  She denies difficulty breathing, difficulty keeping down fluids, nausea, vomiting or diarrhea.  She reports overall she is doing well denies chest pain.   The patient has been diagnosed with pre-existing diabetes in pregnancy.  This diagnosed before 20 weeks.  She reports her fasting blood sugars are less than 95 and postprandials less than 120.  She is checking about twice a day.  The patient reports that her abdominal pain and dysuria have resolved.  She is completing her antibiotics at this time.  She is very concerned about her gallbladder and her liver enzymes as she was given tramadol and pantoprazole she reports for the symptoms.  The patient reports good fetal movement.  She denies vaginal bleeding or loss of fluid  ROS: per HPI  Pertinent PMHx: Obesity affecting pregnancy reviewed weight gain we will need to monitor this closely during pregnancy  Exam:  LMP 07/15/2020   Speaking fully in clear sentences but does have a congested voice.  Assessment/Plan:  Diabetes mellitus affecting pregnancy Fasting blood sugar and postprandials are currently at goal.  She will need routine fetal monitoring  bleeding in the third trimester.  She is following with high risk OB for her pregnancy and has follow-up with them for the remainder of her prenatal care.  Right flank pain Improving, ordered repeat blood work for later this week pending her Covid test.   Sinus Congestion upper respiratory tract infection most likely, recommended Covid testing.  Discussed return precautions at length.  Encourage fluids discussed what over-the-counter medication she should take.  She is going to get tested tomorrow and return for labs this Friday.  I also offered an in person visit this afternoon which she declined.  Supervision of high-risk pregnancy, referred to Dr. Debroah Loop and his team at [facility].  Appreciate consultation and care. She reports good fetal movement today.  Unable to assess fetal heart tones today as this visit was transitioned to virtual.  She will follow-up with them for the remainder of her pregnancy.  Additional pregnancy complications include marginal cord insertion and echogenic intracardiac focus.  Time spent during visit with patient: 12 minutes

## 2020-12-19 NOTE — Assessment & Plan Note (Signed)
Improving, ordered repeat blood work for later this week pending her Covid test.

## 2020-12-23 ENCOUNTER — Other Ambulatory Visit: Payer: Medicaid Other

## 2020-12-27 ENCOUNTER — Other Ambulatory Visit: Payer: Medicaid Other | Admitting: Registered"

## 2020-12-27 ENCOUNTER — Encounter: Payer: Medicaid Other | Attending: Obstetrics | Admitting: Registered"

## 2020-12-27 DIAGNOSIS — O24419 Gestational diabetes mellitus in pregnancy, unspecified control: Secondary | ICD-10-CM | POA: Diagnosis present

## 2020-12-28 ENCOUNTER — Inpatient Hospital Stay (HOSPITAL_COMMUNITY)
Admission: AD | Admit: 2020-12-28 | Discharge: 2020-12-28 | Disposition: A | Discharge status: Home / Self Care | Payer: Medicaid Other | Attending: Obstetrics and Gynecology | Admitting: Obstetrics and Gynecology

## 2020-12-28 ENCOUNTER — Encounter (HOSPITAL_COMMUNITY): Payer: Self-pay | Admitting: Obstetrics and Gynecology

## 2020-12-28 ENCOUNTER — Other Ambulatory Visit: Payer: Self-pay

## 2020-12-28 DIAGNOSIS — R519 Headache, unspecified: Secondary | ICD-10-CM | POA: Diagnosis not present

## 2020-12-28 DIAGNOSIS — R10A1 Flank pain, right side: Secondary | ICD-10-CM

## 2020-12-28 DIAGNOSIS — Z79899 Other long term (current) drug therapy: Secondary | ICD-10-CM | POA: Diagnosis not present

## 2020-12-28 DIAGNOSIS — R0981 Nasal congestion: Secondary | ICD-10-CM | POA: Diagnosis not present

## 2020-12-28 DIAGNOSIS — Z20822 Contact with and (suspected) exposure to covid-19: Secondary | ICD-10-CM | POA: Insufficient documentation

## 2020-12-28 DIAGNOSIS — Z87440 Personal history of urinary (tract) infections: Secondary | ICD-10-CM | POA: Diagnosis not present

## 2020-12-28 DIAGNOSIS — O99512 Diseases of the respiratory system complicating pregnancy, second trimester: Secondary | ICD-10-CM | POA: Diagnosis not present

## 2020-12-28 DIAGNOSIS — O99891 Other specified diseases and conditions complicating pregnancy: Secondary | ICD-10-CM

## 2020-12-28 DIAGNOSIS — Z3A23 23 weeks gestation of pregnancy: Secondary | ICD-10-CM | POA: Diagnosis not present

## 2020-12-28 DIAGNOSIS — R109 Unspecified abdominal pain: Secondary | ICD-10-CM

## 2020-12-28 DIAGNOSIS — Z7982 Long term (current) use of aspirin: Secondary | ICD-10-CM | POA: Insufficient documentation

## 2020-12-28 DIAGNOSIS — O26892 Other specified pregnancy related conditions, second trimester: Secondary | ICD-10-CM | POA: Diagnosis not present

## 2020-12-28 DIAGNOSIS — J069 Acute upper respiratory infection, unspecified: Secondary | ICD-10-CM | POA: Diagnosis not present

## 2020-12-28 LAB — URINALYSIS, ROUTINE W REFLEX MICROSCOPIC
Bilirubin Urine: NEGATIVE
Glucose, UA: NEGATIVE mg/dL
Hgb urine dipstick: NEGATIVE
Ketones, ur: NEGATIVE mg/dL
Leukocytes,Ua: NEGATIVE
Nitrite: NEGATIVE
Protein, ur: NEGATIVE mg/dL
Specific Gravity, Urine: 1.024 (ref 1.005–1.030)
pH: 5 (ref 5.0–8.0)

## 2020-12-28 MED ORDER — ACETAMINOPHEN 500 MG PO TABS: 1000.0000 mg | ORAL_TABLET | Freq: Three times a day (TID) | ORAL | 0 refills | Status: AC | PRN

## 2020-12-28 MED ORDER — METOCLOPRAMIDE HCL 10 MG PO TABS: 10.0000 mg | ORAL_TABLET | Freq: Four times a day (QID) | ORAL | 0 refills | Status: DC | PRN

## 2020-12-28 MED ORDER — FLUTICASONE PROPIONATE 50 MCG/ACT NA SUSP: 2.0000 | Freq: Every day | NASAL | 0 refills | Status: DC

## 2020-12-28 NOTE — MAU Provider Note (Signed)
History     CSN: [id number]  Arrival date and time: 12/28/20 1825   None     Chief Complaint  Patient presents with  . Headache  . Cramping   HPI  Patient is a g2p0010 at [redacted]w[redacted]d who presents for generalized malaise. Reports headache and congestion. Reports that this started last week when she had a UTI. She was taking antibiotics for UTI, and all of her urinary symptoms have resolved. She remains congested though. The only treatment she has tried is one pill of tylenol. Her headache has not improved with this. No other OTC treatments. She was Covid tested on 2/8 and it was negative. She is not covid vaccinated. No known Covid exposures but she does work in a daycare.   Denies any ear involvement. No vision changes. No cough or sore throat. No shortness of breath. No abdominal pain or diarrhea. She did have fevers when she had the UTI but none since.   Denies VB or LOF. Endorses good fetal movement.    OB History    Gravida  2   Para  0   Term  0   Preterm  0   AB  1   Living  0     SAB  1   IAB      Ectopic      Multiple      Live Births              Past Medical History:  Diagnosis Date  . Gestational diabetes   . Medical history non-contributory   . Obesity     Past Surgical History:  Procedure Laterality Date  . NO PAST SURGERIES      Family History  Problem Relation Age of Onset  . Hypertension Mother   . Hyperlipidemia Mother   . Migraines Mother   . Anxiety disorder Mother   . Schizophrenia Brother   . Depression Brother   . Anxiety disorder Brother   . Schizophrenia Maternal Grandmother   . Depression Maternal Grandmother   . Anxiety disorder Maternal Grandmother     Social History   Tobacco Use  . Smoking status: Never Smoker  . Smokeless tobacco: Never Used  Vaping Use  . Vaping Use: Never used  Substance Use Topics  . Alcohol use: Not Currently  . Drug use: Not Currently    Types: Marijuana    Allergies:  Allergies   Allergen Reactions  . Peanuts [Peanut Oil]     Scratchy throat    Medications Prior to Admission  Medication Sig Dispense Refill Last Dose  . Accu-Chek Softclix Lancets lancets Use as instructed 100 each 12   . aspirin 81 MG chewable tablet Chew 1 tablet (81 mg total) by mouth daily. (Patient not taking: Reported on 12/19/2020) 30 tablet 5   . Blood Glucose Monitoring Suppl (ACCU-CHEK GUIDE) w/Device KIT Use to check blood glucose four times daily, keep log of values (Patient not taking: Reported on 12/16/2020) 1 kit 0   . Blood Pressure Monitoring (BLOOD PRESSURE KIT) DEVI Check blood pressure readings at home regularly o09.92 1 each 0   . glucose blood test strip Use as instructed (Patient not taking: Reported on 12/16/2020) 100 each 12   . Misc. Devices (GOJJI WEIGHT SCALE) MISC 1 Device by Does not apply route as needed. 1 each 0   . pantoprazole (PROTONIX) 20 MG tablet Take 1 tablet (20 mg total) by mouth daily. (Patient not taking: Reported on 12/19/2020) 30 tablet 0   .  Prenatal Vit-Fe Fumarate-FA (MULTIVITAMIN-PRENATAL) 27-0.8 MG TABS tablet Take 1 tablet by mouth daily at 12 noon.     . promethazine (PHENERGAN) 12.5 MG tablet Take 1 tablet (12.5 mg total) by mouth every 6 (six) hours as needed for nausea or vomiting. (Patient not taking: Reported on 12/16/2020) 30 tablet 1   . traMADol (ULTRAM) 50 MG tablet Take 1 tablet (50 mg total) by mouth every 6 (six) hours as needed for moderate pain. 10 tablet 0     Review of Systems  Constitutional: Negative for activity change, appetite change, chills and fever.  HENT: Positive for congestion and rhinorrhea. Negative for ear pain.   Respiratory: Negative for shortness of breath.   Cardiovascular: Negative for chest pain.  Gastrointestinal: Negative for abdominal pain, nausea and vomiting.  Genitourinary: Negative for difficulty urinating.  Neurological: Positive for headaches. Negative for dizziness.   Physical Exam   Blood pressure 121/67,  pulse 81, temperature 98.2 F (36.8 C), temperature source Oral, resp. rate 18, height $RemoveBe'5\' 4"'ZnYZSeTQQ$  (1.626 m), weight 97.4 kg, last menstrual period 07/15/2020, SpO2 100 %.  Physical Exam Vitals and nursing note reviewed.  Constitutional:      Appearance: She is well-developed.  HENT:     Head: Normocephalic and atraumatic.  Pulmonary:     Effort: Pulmonary effort is normal.     Breath sounds: No wheezing.  Skin:    General: Skin is warm and dry.  Neurological:     Mental Status: She is alert.  Psychiatric:        Mood and Affect: Mood normal.        Behavior: Behavior normal.     MAU Course  Procedures  MDM Patient evaluated at bedside NST Reactive: baseline 150, mod variability, pos accels, no decels  Covid swab obtained, VSS Stable for d/c home   Assessment and Plan   Congestion, Upper respiratory illness -reswab for Covid, will call with results and if positive can discuss mAb therapy. Provided with home measures including flonase, tylenol, zyrtec. Discussed return precautions, patient verbalized understanding. Also discussed covid vaccine with patient, but she is still uncertain of getting vaccinated.    Janet Berlin 12/28/2020, 7:01 PM

## 2020-12-28 NOTE — MAU Note (Signed)
Presents with c/o lower abdominal cramping, H/A x3 days, and generally not feeling well.  Reports was tested for Covid on February 8th, states test negative.  Reports took Tylenol for H/A, hasn't worked, last taken Monday night.   Denies VB or LOF.  Endorses +FM.

## 2020-12-28 NOTE — Discharge Instructions (Signed)
Safe Medications in Pregnancy   Backache/Headache:  Tylenol: 2 regular strength every 4 hours OR        2 Extra strength every 6 hours   Colds/Coughs/Allergies:  Benadryl (alcohol free) 25 mg every 6 hours as needed  Breath right strips  Claritin  Cepacol throat lozenges  Chloraseptic throat spray  Cold-Eeze- up to three times per day  Cough drops, alcohol free  Flonase (by prescription only)  Guaifenesin  Mucinex  Robitussin DM (plain only, alcohol free)  Saline nasal spray/drops  Sudafed (pseudoephedrine) & Actifed * use only after [redacted] weeks gestation and if you do not have high blood pressure  Tylenol  Vicks Vaporub  Zinc lozenges  Zyrtec   Constipation:  Colace  Ducolax suppositories  Fleet enema  Glycerin suppositories  Metamucil  Milk of magnesia  Miralax  Senokot  Smooth move tea   Diarrhea:  Kaopectate  Imodium A-D   *NO pepto Bismol   Hemorrhoids:  Anusol  Anusol HC  Preparation H  Tucks   Indigestion:  Tums  Maalox  Mylanta  Zantac  Pepcid   Insomnia:  Benadryl (alcohol free) 25mg  every 6 hours as needed  Tylenol PM  Unisom, no Gelcaps   Leg Cramps:  Tums  MagGel   Nausea/Vomiting:  Bonine  Dramamine  Emetrol  Ginger extract  Sea bands  Meclizine  Nausea medication to take during pregnancy:  Unisom (doxylamine succinate 25 mg tablets) Take one tablet daily at bedtime. If symptoms are not adequately controlled, the dose can be increased to a maximum recommended dose of two tablets daily (1/2 tablet in the morning, 1/2 tablet mid-afternoon and one at bedtime).  Vitamin B6 100mg  tablets. Take one tablet twice a day (up to 200 mg per day).   Skin Rashes:  Aveeno products  Benadryl cream or 25mg  every 6 hours as needed  Calamine Lotion  1% cortisone cream   Yeast infection:  Gyne-lotrimin 7  Monistat 7    **If taking multiple medications, please check labels to avoid duplicating the same active ingredients  **take  medication as directed on the label  ** Do not exceed 4000 mg of tylenol in 24 hours  **Do not take medications that contain aspirin or ibuprofen

## 2020-12-29 LAB — SARS CORONAVIRUS 2 (TAT 6-24 HRS): SARS Coronavirus 2: NEGATIVE

## 2020-12-29 NOTE — Progress Notes (Signed)
Patient was seen on 12/27/20 for Gestational Diabetes self-management. EDD 04/21/21; [redacted]w[redacted]d. Patient states no history of GDM. Diet history obtained. Patient eats variety of all food groups. Pt reports frequent hunger. Beverages include water, minute maid lemonade, tea 4x/month.  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 Pt reports she has lost 16 lbs since becoming pregnant.  Pt reports recent episode of kidney infection, has had cold symptoms, tested negative for COVID  Note in Epic 11/16/20, Dorris Singh, MD, Family medicine PCP "... previously had an A1c in April which was 6.0, giving of the previous diagnosis of prediabetes."  The following learning objectives were met by the patient :   States the definition of Gestational Diabetes  States why dietary management is important in controlling blood glucose  Describes the effects of carbohydrates on blood glucose levels  Demonstrates ability to create a balanced meal plan  Demonstrates carbohydrate counting   States when to check blood glucose levels  Demonstrates proper blood glucose monitoring techniques  States the effect of stress and exercise on blood glucose levels  States the importance of limiting caffeine and abstaining from alcohol and smoking  Plan:  Aim for 3 Carbohydrate Choices per meal (45 grams) +/- 1 either way  Aim for 1-2 Carbohydrate Choices per snack Begin reading food labels for Total Carbohydrate of foods If OK with your MD, consider  increasing your activity level by walking, Arm Chair Exercises or other activity daily as tolerated Begin checking Blood Glucose before breakfast and 2 hours after first bite of breakfast, lunch and dinner as directed by MD  Bring Log Book/Sheet and meter to every medical appointment  Baby Scripts: (BS 2.0 not capable of glucose management at this time.) Patient to record blood sugar on glucose log sheet  Take medication if  directed by MD  Patient already has a meter, is testing pre breakfast and 2 hours after each meal. Patient reports CBGs 85-135 mg/dL  Patient instructed to monitor glucose levels: FBS: 60 - 95 mg/dl 2 hour: <120 mg/dl  Patient received the following handouts:  Nutrition Diabetes and Pregnancy  Carbohydrate Counting List  Blood glucose Log Sheet  Patient will be seen for follow-up as needed.

## 2021-01-03 ENCOUNTER — Encounter: Payer: Medicaid Other | Admitting: Obstetrics & Gynecology

## 2021-01-05 ENCOUNTER — Inpatient Hospital Stay (HOSPITAL_COMMUNITY)
Admission: AD | Admit: 2021-01-05 | Discharge: 2021-01-05 | Disposition: A | Discharge status: Home / Self Care | Payer: Medicaid Other | Attending: Obstetrics and Gynecology | Admitting: Obstetrics and Gynecology

## 2021-01-05 ENCOUNTER — Encounter (HOSPITAL_COMMUNITY): Payer: Self-pay | Admitting: Obstetrics & Gynecology

## 2021-01-05 ENCOUNTER — Other Ambulatory Visit: Payer: Self-pay

## 2021-01-05 DIAGNOSIS — O99891 Other specified diseases and conditions complicating pregnancy: Secondary | ICD-10-CM

## 2021-01-05 DIAGNOSIS — W19XXXA Unspecified fall, initial encounter: Secondary | ICD-10-CM

## 2021-01-05 DIAGNOSIS — Z3A24 24 weeks gestation of pregnancy: Secondary | ICD-10-CM | POA: Diagnosis not present

## 2021-01-05 DIAGNOSIS — M7918 Myalgia, other site: Secondary | ICD-10-CM

## 2021-01-05 DIAGNOSIS — Z043 Encounter for examination and observation following other accident: Secondary | ICD-10-CM | POA: Diagnosis present

## 2021-01-05 DIAGNOSIS — W010XXA Fall on same level from slipping, tripping and stumbling without subsequent striking against object, initial encounter: Secondary | ICD-10-CM | POA: Insufficient documentation

## 2021-01-05 MED ORDER — ACETAMINOPHEN 500 MG PO TABS
1000.0000 mg | ORAL_TABLET | Freq: Once | ORAL | Status: AC
  Administered 2021-01-05: 1000 mg via ORAL
  Filled 2021-01-05: qty 2

## 2021-01-05 NOTE — MAU Provider Note (Signed)
History     CSN: [id number]  Arrival date and time: 01/05/21 0749  Event Date/Time  First Provider Initiated Contact with Patient 01/05/21 0815      Chief Complaint  Patient presents with  . Fall   HPI Jacqueline Yu is a 20 y.o. G2P0010 at 32w6dwho presents to MAU for initial evaluation [facility] Patient endorses being distracted, slipping and landing on her right hip and upper thigh. She denies abdominal trauma. She endorses discomfort at the location of impact. She denies syncope, weakness.  She denies abdominal pain, vaginal bleeding, leaking of fluid, decreased fetal movement, fever, falls, or recent illness.   [facility]   OB History    Gravida  2   Para  0   Term  0   Preterm  0   AB  1   Living  0     SAB  1   IAB      Ectopic      Multiple      Live Births              Past Medical History:  Diagnosis Date  . Gestational diabetes   . Medical history non-contributory   . Obesity     Past Surgical History:  Procedure Laterality Date  . NO PAST SURGERIES      Family History  Problem Relation Age of Onset  . Hypertension Mother   . Hyperlipidemia Mother   . Migraines Mother   . Anxiety disorder Mother   . Schizophrenia Brother   . Depression Brother   . Anxiety disorder Brother   . Schizophrenia Maternal Grandmother   . Depression Maternal Grandmother   . Anxiety disorder Maternal Grandmother     Social History   Tobacco Use  . Smoking status: Never Smoker  . Smokeless tobacco: Never Used  Vaping Use  . Vaping Use: Never used  Substance Use Topics  . Alcohol use: Not Currently  . Drug use: Not Currently    Types: Marijuana    Allergies:  Allergies  Allergen Reactions  . Peanuts [Peanut Oil]     Scratchy throat    Medications Prior to Admission  Medication Sig Dispense Refill Last Dose  . Accu-Chek Softclix Lancets lancets Use as instructed 100 each 12    . acetaminophen (TYLENOL) 500 MG tablet Take 2 tablets (1,000 mg total) by mouth every 8 (eight) hours as needed for headache. 100 tablet 0   . aspirin 81 MG chewable tablet Chew 1 tablet (81 mg total) by mouth daily. (Patient not taking: Reported on 12/19/2020) 30 tablet 5   . Blood Glucose Monitoring Suppl (ACCU-CHEK GUIDE) w/Device KIT Use to check blood glucose four times daily, keep log of values (Patient not taking: Reported on 12/16/2020) 1 kit 0   . Blood Pressure Monitoring (BLOOD PRESSURE KIT) DEVI Check blood pressure readings at home regularly o09.92 1 each 0   . fluticasone (FLONASE) 50 MCG/ACT nasal spray Place 2 sprays into both nostrils daily. 1 g 0   . glucose blood test strip Use as instructed (Patient not taking: Reported on 12/16/2020) 100 each 12   . metoCLOPramide (REGLAN) 10 MG tablet Take 1 tablet (10 mg total) by mouth 4 (four) times daily as needed for nausea or vomiting (or headache). 30 tablet 0   . Misc. Devices (GOJJI WEIGHT SCALE) MISC 1 Device by Does not apply route as needed. 1 each 0   .  Prenatal Vit-Fe Fumarate-FA (MULTIVITAMIN-PRENATAL) 27-0.8 MG TABS tablet Take 1 tablet by mouth daily at 12 noon.       Review of Systems  Gastrointestinal: Negative for abdominal pain.  Genitourinary: Negative for vaginal bleeding.  Musculoskeletal: Positive for myalgias.  All other systems reviewed and are negative.  Physical Exam   Blood pressure 126/67, pulse (!) 103, temperature 97.7 F (36.5 C), resp. rate 18, height _0  (1.626 m), weight 97.5 kg, last menstrual period 07/15/2020, SpO2 98 %.  Physical Exam Vitals and nursing note reviewed. Exam conducted with a chaperone present.  Constitutional:      Appearance: Normal appearance. She is not ill-appearing.  Cardiovascular:     Rate and Rhythm: Normal rate.     Pulses: Normal pulses.     Heart sounds: Normal heart sounds.  Pulmonary:     Effort: Pulmonary effort is normal.     Breath sounds: Normal breath  sounds.  Abdominal:     Comments: Gravid  Skin:    Capillary Refill: Capillary refill takes less than 2 seconds.          Comments: No bruising, swelling or tenderness to palpation  Neurological:     Mental Status: She is alert and oriented to person, place, and time.  Psychiatric:        Mood and Affect: Mood normal.        Behavior: Behavior normal.        Thought Content: Thought content normal.        Judgment: Judgment normal.    MAU Course  Procedures  Patient Vitals for the past 24 hrs:  BP Temp Temp src Pulse Resp SpO2 Height Weight  01/05/21 1210 -- 98.5 F (36.9 C) Oral -- 18 -- -- --  01/05/21 1207 126/67 -- -- (!) 103 -- -- -- --  01/05/21 0845 126/71 -- -- 78 -- -- -- --  01/05/21 0810 137/75 -- -- 81 -- 98 % -- --  01/05/21 0759 123/72 97.7 F (36.5 C) -- 77 18 -- _1  (1.626 m) 97.5 kg   --Reactive tracing: baseline 145, mod var, + 10 x 10 accels, no decels --Toco: quiet  Assessment and Plan  --20 y.o. G2P0010 at [redacted]w[redacted]d --Fall at home at 0715, initial encounter --S/p 4 hours continuous monitoring in MAU --No acute events during period of evaluation --Discharge home in stable condition  F[facility]U: --[facility] patient has MFM appointment tomorrow  SDarlina Rumpf CNM 01/05/2021, 3:33 PM

## 2021-01-05 NOTE — MAU Note (Signed)
Pt reports she slipped on fell down her stair around 7:15 this morning. Landing  on her buttock and right hip and thigh are hurting at this time. After the fall pt reports "baby went crazy" moving all around. Denies any vag bleeding or leaking at this time.

## 2021-01-05 NOTE — Discharge Instructions (Signed)

## 2021-01-06 ENCOUNTER — Encounter: Payer: Self-pay | Admitting: *Deleted

## 2021-01-06 ENCOUNTER — Ambulatory Visit: Payer: Medicaid Other | Attending: Obstetrics and Gynecology

## 2021-01-06 ENCOUNTER — Ambulatory Visit: Payer: Medicaid Other | Admitting: *Deleted

## 2021-01-06 ENCOUNTER — Other Ambulatory Visit: Payer: Self-pay | Admitting: *Deleted

## 2021-01-06 DIAGNOSIS — O283 Abnormal ultrasonic finding on antenatal screening of mother: Secondary | ICD-10-CM | POA: Insufficient documentation

## 2021-01-06 DIAGNOSIS — R109 Unspecified abdominal pain: Secondary | ICD-10-CM

## 2021-01-06 DIAGNOSIS — Z362 Encounter for other antenatal screening follow-up: Secondary | ICD-10-CM

## 2021-01-06 DIAGNOSIS — O43199 Other malformation of placenta, unspecified trimester: Secondary | ICD-10-CM

## 2021-01-06 DIAGNOSIS — O99212 Obesity complicating pregnancy, second trimester: Secondary | ICD-10-CM

## 2021-01-06 DIAGNOSIS — O43192 Other malformation of placenta, second trimester: Secondary | ICD-10-CM

## 2021-01-06 DIAGNOSIS — Z3A25 25 weeks gestation of pregnancy: Secondary | ICD-10-CM

## 2021-01-06 DIAGNOSIS — E669 Obesity, unspecified: Secondary | ICD-10-CM

## 2021-01-06 DIAGNOSIS — O24410 Gestational diabetes mellitus in pregnancy, diet controlled: Secondary | ICD-10-CM

## 2021-01-16 ENCOUNTER — Other Ambulatory Visit: Payer: Self-pay

## 2021-01-16 ENCOUNTER — Ambulatory Visit (INDEPENDENT_AMBULATORY_CARE_PROVIDER_SITE_OTHER): Payer: Medicaid Other | Admitting: Obstetrics & Gynecology

## 2021-01-16 VITALS — BP 114/76 | HR 80 | Wt 219.0 lb

## 2021-01-16 DIAGNOSIS — R109 Unspecified abdominal pain: Secondary | ICD-10-CM

## 2021-01-16 DIAGNOSIS — O0990 Supervision of high risk pregnancy, unspecified, unspecified trimester: Secondary | ICD-10-CM

## 2021-01-16 DIAGNOSIS — O43199 Other malformation of placenta, unspecified trimester: Secondary | ICD-10-CM

## 2021-01-16 DIAGNOSIS — O24913 Unspecified diabetes mellitus in pregnancy, third trimester: Secondary | ICD-10-CM

## 2021-01-16 NOTE — Patient Instructions (Signed)

## 2021-01-16 NOTE — Progress Notes (Signed)
   PRENATAL VISIT NOTE  Subjective:  Clementine Soulliere is a 20 y.o. G2P0010 at [redacted]w[redacted]d being seen today for ongoing prenatal care.  She is currently monitored for the following issues for this high-risk pregnancy and has History of trauma ; ASCUS with positive high risk HPV cervical; Supervision of other normal pregnancy, antepartum; Diabetes mellitus affecting pregnancy; Fetal cardiac echogenic focus; Marginal insertion of umbilical cord affecting management of mother; Right flank pain; and Supervision of high risk pregnancy, antepartum on their problem list.  Patient reports no complaints.  Contractions: Not present. Vag. Bleeding: None.  Movement: Present. Denies leaking of fluid.   The following portions of the patient's history were reviewed and updated as appropriate: allergies, current medications, past family history, past medical history, past social history, past surgical history and problem list.   Objective:   Vitals:   01/16/21 1548  BP: 114/76  Pulse: 80  Weight: 219 lb (99.3 kg)    Fetal Status: Fetal Heart Rate (bpm): 145   Movement: Present     General:  Alert, oriented and cooperative. Patient is in no acute distress.  Skin: Skin is warm and dry. No rash noted.   Cardiovascular: Normal heart rate noted  Respiratory: Normal respiratory effort, no problems with respiration noted  Abdomen: Soft, gravid, appropriate for gestational age.  Pain/Pressure: Absent     Pelvic: Cervical exam deferred        Extremities: Normal range of motion.     Mental Status: Normal mood and affect. Normal behavior. Normal judgment and thought content.   Assessment and Plan:  Pregnancy: G2P0010 at [redacted]w[redacted]d 1. Right flank pain   2. Supervision of high risk pregnancy, antepartum diabetes  3. Marginal insertion of umbilical cord affecting management of mother F/u groqth  4. Diabetes mellitus affecting pregnancy in third trimester States all values are<90, needs to bring her log with  her  Preterm labor symptoms and general obstetric precautions including but not limited to vaginal bleeding, contractions, leaking of fluid and fetal movement were reviewed in detail with the patient. Please refer to After Visit Summary for other counseling recommendations.   Return in about 2 weeks (around 01/30/2021).  Future Appointments  Date Time Provider Department Center  01/30/2021  2:40 PM Raelyn Mora, CNM [facility] None  02/10/2021  3:15 PM [facility] NURSE [facility]  02/10/2021  3:30 PM [facility] US3 [facility]    Scheryl Darter, MD

## 2021-01-30 ENCOUNTER — Other Ambulatory Visit (HOSPITAL_COMMUNITY)
Admission: RE | Admit: 2021-01-30 | Discharge: 2021-01-30 | Disposition: A | Discharge status: Home / Self Care | Payer: Medicaid Other | Source: Ambulatory Visit | Attending: Obstetrics and Gynecology | Admitting: Obstetrics and Gynecology

## 2021-01-30 ENCOUNTER — Ambulatory Visit (INDEPENDENT_AMBULATORY_CARE_PROVIDER_SITE_OTHER): Payer: Medicaid Other | Admitting: Obstetrics and Gynecology

## 2021-01-30 ENCOUNTER — Other Ambulatory Visit: Payer: Self-pay

## 2021-01-30 ENCOUNTER — Encounter: Payer: Self-pay | Admitting: Obstetrics and Gynecology

## 2021-01-30 VITALS — BP 125/82 | HR 84 | Wt 215.0 lb

## 2021-01-30 DIAGNOSIS — N898 Other specified noninflammatory disorders of vagina: Secondary | ICD-10-CM

## 2021-01-30 DIAGNOSIS — O0990 Supervision of high risk pregnancy, unspecified, unspecified trimester: Secondary | ICD-10-CM

## 2021-01-30 DIAGNOSIS — O24913 Unspecified diabetes mellitus in pregnancy, third trimester: Secondary | ICD-10-CM

## 2021-01-30 DIAGNOSIS — Z3A28 28 weeks gestation of pregnancy: Secondary | ICD-10-CM

## 2021-01-30 NOTE — Patient Instructions (Addendum)
Gestational Diabetes Mellitus, Diagnosis Gestational diabetes mellitus is a form of diabetes. It can happen when you are pregnant. The diabetes goes away after you give birth. If you do not get treated for this condition, it may cause problems for you or your baby. What are the causes? This condition is caused by changes in your body when you are pregnant. When these happen:  A part of the body called the pancreas does not make enough insulin.  The body cannot use insulin in the right way. Sugars cannot get into cells in your body. The sugars stay in your blood. This leads to high blood sugar.   What increases the risk?  Being older than age 25 when pregnant.  Having someone with diabetes in your family.  Too much body weight.  Having had this condition in the past.  Polycystic ovary syndrome.  Being pregnant with more than one baby. What are the signs or symptoms?  Being thirsty often.  Being hungry often.  Needing to pee more often. How is this treated?  Eat a healthy diet.  Get more exercise.  Check your blood sugar often.  Take insulin and other medicines, if needed.  Work with an expert on this condition, if told. Follow these instructions at home: Learn about your diabetes Ask your doctor:  How often should I check my blood sugar? Where do I get the equipment?  What medicines do I need? When should I take them?  Do I need to meet with an educator?  Who can I call if I have questions?  Where can I find a support group? General instructions  Take medicines only as told by your doctor.  Stay at a healthy weight.  Drink enough fluid to keep your pee pale yellow.  Wear an alert bracelet or carry a card that shows you have this condition.  Keep all follow-up visits. Where to find more information  American Diabetes Association (ADA): diabetes.org  Association of Diabetes Care & Education Specialists (ADCES): diabeteseducator.org  Centers for  Disease Control and Prevention (CDC): cdc.gov  American Pregnancy Association: americanpregnancy.org  [place] Department of Agriculture MyPlate: myplate.gov Contact a doctor if:  Your blood sugar is at or above 240 mg/dL (13.3 mmol/L).  Your blood sugar is at or above 200 mg/dL (11.1 mmol/L) and you have ketones in your pee.  You have a fever.  You are sick for 2 days or more and you do not get better.  You have either of these problems for more than 6 hours: ? You vomit every time you eat or drink. ? You have watery poop (diarrhea). Get help right away if:  You cannot think clearly.  You are not breathing well.  You have a lot of ketones in your pee.  Your baby seems to move less than normal.  Abnormal fluid or blood starts to come out of your vagina.  You start having contractions before your due date. You may feel your belly tighten.  You have a very bad headache. These symptoms may be an emergency. Get help right away. Call your local emergency services (911 in the [place]  Do not wait to see if the symptoms will go away.  Do not drive yourself to the hospital. Summary  Gestational diabetes is a form of diabetes. It can happen when you are pregnant.  This condition occurs when your body cannot make or use insulin in the right way.  Eat a healthy diet, exercise, and use medicines or insulin   as told by your doctor.  Tell your doctor if your blood sugar is high, you have a fever, or you vomit every time you eat or drink.  Get help right away if you cannot think clearly, you are not breathing well, or your baby seems to move less than normal. This information is not intended to replace advice given to you by your health care provider. Make sure you discuss any questions you have with your health care provider. Document Revised: 04/04/2020 Document Reviewed: 04/04/2020 Elsevier Patient Education  2021 [employer].  

## 2021-01-30 NOTE — Progress Notes (Signed)
ROB, she did not pick up Rx for the test strips because her Insurance does not pay for it.

## 2021-01-30 NOTE — Progress Notes (Signed)
°  HIGH-RISK PREGNANCY OFFICE VISIT Patient name: Jacqueline Yu MRN [id number]  Date of birth: 07-11-2001 Chief Complaint:   Routine Prenatal Visit  History of Present Illness:   Sheronda Parran is a 20 y.o. G54P0010 female at [redacted]w[redacted]d with an Estimated Date of Delivery: 04/21/21 being seen today for ongoing management of a high-risk pregnancy complicated by A1DM.  Today she reports she is not checking her blood sugars, because she was told by [employer] that her insurance does not cover the test strips. Contractions: Not present. Vag. Bleeding: None.  Movement: Present. denies leaking of fluid.  Review of Systems:   Pertinent items are noted in HPI Denies abnormal vaginal discharge w/ itching/odor/irritation, headaches, visual changes, shortness of breath, chest pain, abdominal pain, severe nausea/vomiting, or problems with urination or bowel movements unless otherwise stated above. Pertinent History Reviewed:  Reviewed past medical,surgical, social, obstetrical and family history.  Reviewed problem list, medications and allergies. Physical Assessment:   Vitals:   01/30/21 1508  BP: 125/82  Pulse: 84  Weight: 215 lb (97.5 kg)  Body mass index is 36.9 kg/m.           Physical Examination:   General appearance: alert, well appearing, and in no distress, oriented to person, place, and time and normal appearing weight  Mental status: alert, oriented to person, place, and time  Skin: warm & dry   Extremities: Edema: None    Cardiovascular: normal heart rate noted  Respiratory: normal respiratory effort, no distress  Abdomen: gravid, soft, non-tender  Pelvic: Cervical exam deferred         Fetal Status: Fetal Heart Rate (bpm): 141   Movement: Present    Fetal Surveillance Testing today: none   No results found for this or any previous visit (from the past 24 hour(s)).  Assessment & Plan:  1) High-risk pregnancy G2P0010 at [redacted]w[redacted]d with an Estimated Date of Delivery: 04/21/21   2) Supervision of high  risk pregnancy, antepartum  3) Diabetes mellitus affecting pregnancy in third trimester - Advised of the importance of keeping a close tract of blood sugars - Message sent to [facility] for advise on how patient can get test strips  4) Vaginal irritation - Cervicovaginal ancillary only  5) [redacted] weeks gestation of pregnancy   Meds: No orders of the defined types were placed in this encounter.   Labs/procedures today: wet prep  Treatment Plan:  none  Reviewed: Preterm labor symptoms and general obstetric precautions including but not limited to vaginal bleeding, contractions, leaking of fluid and fetal movement were reviewed in detail with the patient.  All questions were answered. Has home bp cuff. Check bp weekly, let us know if >140/90.   Follow-up: Return in about 4 weeks (around 02/27/2021) for Return OB - My Chart video.  No orders of the defined types were placed in this encounter.  Raelyn Mora MSN, CNM 01/30/2021 3:34 PM

## 2021-01-31 LAB — CERVICOVAGINAL ANCILLARY ONLY
Bacterial Vaginitis (gardnerella): NEGATIVE
Candida Glabrata: NEGATIVE
Candida Vaginitis: POSITIVE — AB
Chlamydia: NEGATIVE
Comment: NEGATIVE
Comment: NEGATIVE
Comment: NEGATIVE
Comment: NEGATIVE
Comment: NEGATIVE
Comment: NORMAL
Neisseria Gonorrhea: NEGATIVE
Trichomonas: NEGATIVE

## 2021-02-06 ENCOUNTER — Other Ambulatory Visit (INDEPENDENT_AMBULATORY_CARE_PROVIDER_SITE_OTHER): Payer: Medicaid Other | Admitting: Obstetrics and Gynecology

## 2021-02-06 DIAGNOSIS — B3731 Acute candidiasis of vulva and vagina: Secondary | ICD-10-CM

## 2021-02-06 DIAGNOSIS — B373 Candidiasis of vulva and vagina: Secondary | ICD-10-CM

## 2021-02-06 MED ORDER — TERCONAZOLE 0.4 % VA CREA: 1.0000 | TOPICAL_CREAM | Freq: Every day | VAGINAL | 0 refills | Status: AC

## 2021-02-06 NOTE — Progress Notes (Signed)
c 

## 2021-02-10 ENCOUNTER — Other Ambulatory Visit: Payer: Self-pay

## 2021-02-10 ENCOUNTER — Other Ambulatory Visit: Payer: Self-pay | Admitting: Maternal & Fetal Medicine

## 2021-02-10 ENCOUNTER — Ambulatory Visit: Payer: Medicaid Other | Admitting: *Deleted

## 2021-02-10 ENCOUNTER — Encounter: Payer: Self-pay | Admitting: *Deleted

## 2021-02-10 ENCOUNTER — Ambulatory Visit: Payer: Medicaid Other | Attending: Maternal & Fetal Medicine

## 2021-02-10 DIAGNOSIS — O99213 Obesity complicating pregnancy, third trimester: Secondary | ICD-10-CM | POA: Diagnosis not present

## 2021-02-10 DIAGNOSIS — E669 Obesity, unspecified: Secondary | ICD-10-CM

## 2021-02-10 DIAGNOSIS — O365930 Maternal care for other known or suspected poor fetal growth, third trimester, not applicable or unspecified: Secondary | ICD-10-CM

## 2021-02-10 DIAGNOSIS — O24410 Gestational diabetes mellitus in pregnancy, diet controlled: Secondary | ICD-10-CM

## 2021-02-10 DIAGNOSIS — O43199 Other malformation of placenta, unspecified trimester: Secondary | ICD-10-CM | POA: Diagnosis present

## 2021-02-10 DIAGNOSIS — Z3A30 30 weeks gestation of pregnancy: Secondary | ICD-10-CM

## 2021-02-10 DIAGNOSIS — R109 Unspecified abdominal pain: Secondary | ICD-10-CM

## 2021-02-10 DIAGNOSIS — Z362 Encounter for other antenatal screening follow-up: Secondary | ICD-10-CM | POA: Diagnosis not present

## 2021-02-10 DIAGNOSIS — O43193 Other malformation of placenta, third trimester: Secondary | ICD-10-CM | POA: Diagnosis not present

## 2021-02-10 NOTE — Procedures (Signed)
Jacqueline Yu [birth year] [redacted]w[redacted]d  Fetus A Non-Stress Test Interpretation for 02/10/21  Indication: IUGR  Fetal Heart Rate A Mode: External Baseline Rate (A): 140 bpm Variability: Moderate Accelerations: 10 x 10 Decelerations: None Multiple birth?: No  Uterine Activity Mode: Palpation,Toco Contraction Frequency (min): None Resting Tone Palpated: Relaxed Resting Time: Adequate  Interpretation (Fetal Testing) Nonstress Test Interpretation: Reactive Comments: Dr, Grace Bushy reviewed tracing.

## 2021-02-13 ENCOUNTER — Ambulatory Visit (INDEPENDENT_AMBULATORY_CARE_PROVIDER_SITE_OTHER): Payer: Medicaid Other | Admitting: Advanced Practice Midwife

## 2021-02-13 ENCOUNTER — Other Ambulatory Visit: Payer: Self-pay | Admitting: *Deleted

## 2021-02-13 ENCOUNTER — Other Ambulatory Visit: Payer: Self-pay

## 2021-02-13 VITALS — BP 130/85 | HR 95 | Wt 214.0 lb

## 2021-02-13 DIAGNOSIS — O24913 Unspecified diabetes mellitus in pregnancy, third trimester: Secondary | ICD-10-CM | POA: Diagnosis not present

## 2021-02-13 DIAGNOSIS — O365990 Maternal care for other known or suspected poor fetal growth, unspecified trimester, not applicable or unspecified: Secondary | ICD-10-CM

## 2021-02-13 DIAGNOSIS — O0990 Supervision of high risk pregnancy, unspecified, unspecified trimester: Secondary | ICD-10-CM

## 2021-02-13 DIAGNOSIS — O365930 Maternal care for other known or suspected poor fetal growth, third trimester, not applicable or unspecified: Secondary | ICD-10-CM

## 2021-02-13 LAB — GLUCOSE, POCT (MANUAL RESULT ENTRY): POC Glucose: 106 mg/dl — AB (ref 70–99)

## 2021-02-13 NOTE — Progress Notes (Signed)
   PRENATAL VISIT NOTE  Subjective:  Jacqueline Yu is a 20 y.o. G2P0010 at [redacted]w[redacted]d being seen today for ongoing prenatal care.  She is currently monitored for the following issues for this high-risk pregnancy and has History of trauma ; ASCUS with positive high risk HPV cervical; Supervision of other normal pregnancy, antepartum; Diabetes mellitus affecting pregnancy; Fetal cardiac echogenic focus; Marginal insertion of umbilical cord affecting management of mother; Right flank pain; Supervision of high risk pregnancy, antepartum; and Pregnancy affected by fetal growth restriction on their problem list.  Patient reports occasional contractions.  Contractions: Not present. Vag. Bleeding: None.  Movement: Present. Denies leaking of fluid.   The following portions of the patient's history were reviewed and updated as appropriate: allergies, current medications, past family history, past medical history, past social history, past surgical history and problem list.   Objective:   Vitals:   02/13/21 1531  BP: 130/85  Pulse: 95  Weight: 214 lb (97.1 kg)    Fetal Status: Fetal Heart Rate (bpm): 148 Fundal Height: 30 cm Movement: Present     General:  Alert, oriented and cooperative. Patient is in no acute distress.  Skin: Skin is warm and dry. No rash noted.   Cardiovascular: Normal heart rate noted  Respiratory: Normal respiratory effort, no problems with respiration noted  Abdomen: Soft, gravid, appropriate for gestational age.  Pain/Pressure: Absent     Pelvic: Cervical exam deferred        Extremities: Normal range of motion.  Edema: None  Mental Status: Normal mood and affect. Normal behavior. Normal judgment and thought content.   Assessment and Plan:  Pregnancy: G2P0010 at [redacted]w[redacted]d 1. Supervision of high risk pregnancy, antepartum --Anticipatory guidance about next visits/weeks of pregnancy given. --Next visit in 2 weeks in office  2. Diabetes mellitus affecting pregnancy in third  trimester --Pt has a meter she bought from [place] but lancets are painful and she is not taking her sugars.   --POCT glucose today-- random 106 today  3. Pregnancy affected by fetal growth restriction --EFW 3%tile on 02/10/21 --Dopplers wnl --MFM initiated weekly BPP/dopplers this week  Preterm labor symptoms and general obstetric precautions including but not limited to vaginal bleeding, contractions, leaking of fluid and fetal movement were reviewed in detail with the patient. Please refer to After Visit Summary for other counseling recommendations.   Return in about 2 weeks (around 02/27/2021).  Future Appointments  Date Time Provider Department Center  02/16/2021  1:15 PM [facility] NST [facility]  02/16/2021  3:15 PM [facility] NURSE [facility]  02/16/2021  3:30 PM [facility] US1 [facility]  02/23/2021  2:15 PM [facility] NURSE [facility]  02/23/2021  2:30 PM [facility] US7 [facility]  02/27/2021  9:45 AM [facility] NURSE [facility]  02/27/2021 10:00 AM [facility] US1 [facility]    Sharen Counter, CNM

## 2021-02-13 NOTE — Patient Instructions (Signed)
Gestational Diabetes Mellitus, Self-Care Caring for yourself after a diagnosis of gestational diabetes mellitus means keeping your blood sugar under control. This can be done through nutrition, exercise, lifestyle changes, insulin and other medicines, and support from your health care team. Your health care provider will set individualized treatment goals for you. What are the risks? If left untreated, gestational diabetes can cause problems for mother and baby. For the mother Women who get gestational diabetes are more likely to:  Have labor induced and deliver early.  Have problems during labor and delivery, if the baby is larger than normal. This includes difficult labor and damage to the birth canal.  Have a cesarean delivery.  Have problems with blood pressure, including high blood pressure and preeclampsia.  Get it again if they become pregnant.  Develop type 2 diabetes in the future. For the baby Gestational diabetes that is not treated can cause the baby to have:  Low blood glucose (hypoglycemia).  Larger-than-normal body size (macrosomia).  Breathing problems. How to monitor blood glucose Check your blood glucose every day and as often as told by your health care provider. To do this: 1. Wash your hands with soap and water for at least 20 seconds. 2. Prick the side of your finger (not the tip) with the lancet. Use a different finger each time. 3. Gently rub the finger until a small drop of blood appears. 4. Follow instructions that come with your meter for inserting the test strip, applying blood to the strip, and getting the result. 5. Write down your result and any notes. Blood glucose goals are:  95 mg/dL (5.3 mmol/L) when fasting.  140 mg/dL (7.8 mmol/L) 1 hour after a meal.  120 mg/dL (6.7 mmol/L) 2 hours after a meal.   Follow these instructions at home: Medicines  Take over-the-counter and prescription medicines only as told by your health care  provider.  If your health care provider prescribed insulin or other diabetes medicines: ? Take them every day. ? Do not run out of insulin or other medicines. Plan ahead so you always have them available. Eating and drinking  Follow instructions from your health care provider about eating or drinking restrictions.  See a diet and nutrition expert (registered dietician) to help you create an eating plan that helps control your diabetes. The foods in this plan will include: ? Lean proteins. ? Complex carbohydrates. These are carbohydrates that contain fiber, have a lot of nutrients, and are digested slowly. They include dried beans, nuts, and whole grain breads, cereals, or pasta. ? Fresh fruits and vegetables. ? Low-fat dairy products. ? Healthy fats.  Eat healthy snacks between nutritious meals.  Drink enough fluid to keep your urine pale yellow.  Keep a record of the carbohydrates that you eat. To do this: ? Read food labels. ? Learn the standard serving sizes of foods.  Make a sick day plan with your health care provider before you get sick. Follow this plan whenever you cannot eat or drink as usual.   Activity  Do exercises as told by your health care provider.  Do 30 or more minutes of physical activity a day, or as much physical activity as your health care provider recommends. It may help to control blood glucose levels after a meal if you: ? Do 10 minutes of exercise after each meal. ? Start this exercise 30 minutes after the meal.  If you start a new exercise or activity, work with your health care provider to adjust your   insulin, other medicines, or food as needed. Lifestyle  Do not drink alcohol.  Do not use any products that contain nicotine or tobacco, such as cigarettes, e-cigarettes, and chewing tobacco. If you need help quitting, ask your health care provider.  Learn to manage stress. If you need help with this, ask your health care provider. Body care  Keep  your vaccines up to date.  Practice good oral hygiene. To do this: ? Clean your teeth and gums two times a day. ? Floss one or more times a day. ? Visit your dentist one or more times every 6 months.  Stay at a healthy weight while you are pregnant. Your expected weight gain depends on your BMI (body mass index) before pregnancy. General instructions  Talk with your health care provider about the risk for high blood pressure during pregnancy (preeclampsia and eclampsia).  Share your diabetes management plan with people in your workplace, school, and household.  Check your urine for ketones when sick and as told by your health care provider. Ketones are made by the liver when a lack of glucose forces the body to use fat for energy.  Carry a medical alert card or wear medical alert jewelry that says you have gestational diabetes.  Keep all follow-up visits. This is important. Get care after delivery  Have your blood glucose level checked with an oral glucose tolerance test (OGTT) 4-12 weeks after delivery.  Get screened for diabetes at least every 3 years, or as often as told by your health care provider. Where to find more information  American Diabetes Association (ADA): diabetes.org  Association of Diabetes Care & Education Specialists (ADCES): diabeteseducator.org  Centers for Disease Control and Prevention (CDC): cdc.gov  American Pregnancy Association: americanpregnancy.org  [place] Department of Agriculture MyPlate: myplate.gov Contact a health care provider if:  Your blood glucose is above your target for two tests in a row.  You have a fever.  You have been sick for 2 days or more and are not getting better.  You have either of these problems for more than 6 hours: ? Vomiting every time you eat or drink. ? Diarrhea. Get help right away if you:  Become confused or cannot think clearly.  Have trouble breathing.  Have moderate or high ketones in your  urine.  Feel your baby is not moving as usual.  Develop unusual discharge or bleeding from your vagina.  Start having early (premature) contractions. Contractions may feel like a tightening in your lower abdomen  Have a severe headache. These symptoms may represent a serious problem that is an emergency. Do not wait to see if the symptoms will go away. Get medical help right away. Call your local emergency services (911 in the [place] Do not drive yourself to the hospital. Summary  Check your blood glucose every day during your pregnancy. Do this as often as told by your health care provider.  Take insulin or other diabetes medicines every day, if your health care provider prescribed them.  Have your blood glucose level checked 4-12 weeks after delivery.  Keep all follow-up visits. This is important. This information is not intended to replace advice given to you by your health care provider. Make sure you discuss any questions you have with your health care provider. Document Revised: 04/04/2020 Document Reviewed: 04/04/2020 Elsevier Patient Education  2021 Elsevier Inc.  

## 2021-02-13 NOTE — Addendum Note (Signed)
Addended by: Sharen Counter A on: 02/13/2021 04:13 PM   Modules accepted: Orders

## 2021-02-13 NOTE — Progress Notes (Signed)
+   Fetal movement. No complaints. Pt is not checking her blood sugars.

## 2021-02-16 ENCOUNTER — Ambulatory Visit (HOSPITAL_BASED_OUTPATIENT_CLINIC_OR_DEPARTMENT_OTHER): Payer: Medicaid Other

## 2021-02-16 ENCOUNTER — Ambulatory Visit: Payer: Medicaid Other | Attending: Obstetrics and Gynecology | Admitting: *Deleted

## 2021-02-16 ENCOUNTER — Other Ambulatory Visit: Payer: Self-pay

## 2021-02-16 ENCOUNTER — Ambulatory Visit (HOSPITAL_BASED_OUTPATIENT_CLINIC_OR_DEPARTMENT_OTHER): Payer: Medicaid Other | Admitting: *Deleted

## 2021-02-16 ENCOUNTER — Encounter: Payer: Self-pay | Admitting: *Deleted

## 2021-02-16 DIAGNOSIS — O99213 Obesity complicating pregnancy, third trimester: Secondary | ICD-10-CM | POA: Diagnosis not present

## 2021-02-16 DIAGNOSIS — O24419 Gestational diabetes mellitus in pregnancy, unspecified control: Secondary | ICD-10-CM | POA: Diagnosis not present

## 2021-02-16 DIAGNOSIS — O43193 Other malformation of placenta, third trimester: Secondary | ICD-10-CM | POA: Insufficient documentation

## 2021-02-16 DIAGNOSIS — O365930 Maternal care for other known or suspected poor fetal growth, third trimester, not applicable or unspecified: Secondary | ICD-10-CM

## 2021-02-16 DIAGNOSIS — Z3A30 30 weeks gestation of pregnancy: Secondary | ICD-10-CM

## 2021-02-16 DIAGNOSIS — R109 Unspecified abdominal pain: Secondary | ICD-10-CM

## 2021-02-16 DIAGNOSIS — E669 Obesity, unspecified: Secondary | ICD-10-CM

## 2021-02-16 DIAGNOSIS — O24913 Unspecified diabetes mellitus in pregnancy, third trimester: Secondary | ICD-10-CM | POA: Diagnosis not present

## 2021-02-16 DIAGNOSIS — O24410 Gestational diabetes mellitus in pregnancy, diet controlled: Secondary | ICD-10-CM

## 2021-02-16 DIAGNOSIS — O365931 Maternal care for other known or suspected poor fetal growth, third trimester, fetus 1: Secondary | ICD-10-CM

## 2021-02-16 NOTE — Procedures (Signed)
Jacqueline Yu [birth year] [redacted]w[redacted]d  Fetus A Non-Stress Test Interpretation for 02/16/21  Indication: IUGR  Fetal Heart Rate A Mode: External Baseline Rate (A): 140 bpm Variability: Moderate Accelerations: 10 x 10 Decelerations: None Multiple birth?: No  Uterine Activity Mode: Palpation,Toco Contraction Frequency (min): none Resting Tone Palpated: Relaxed Resting Time: Adequate  Interpretation (Fetal Testing) Nonstress Test Interpretation: Reactive Overall Impression: Reassuring for gestational age Comments: Dr. Parke Poisson reviewed tracing

## 2021-02-23 ENCOUNTER — Ambulatory Visit: Payer: Medicaid Other | Attending: Maternal & Fetal Medicine | Admitting: *Deleted

## 2021-02-23 ENCOUNTER — Encounter: Payer: Self-pay | Admitting: *Deleted

## 2021-02-23 ENCOUNTER — Encounter (HOSPITAL_COMMUNITY): Payer: Self-pay | Admitting: Obstetrics & Gynecology

## 2021-02-23 ENCOUNTER — Inpatient Hospital Stay (HOSPITAL_COMMUNITY)
Admission: AD | Admit: 2021-02-23 | Discharge: 2021-02-23 | Disposition: A | Discharge status: Home / Self Care | Payer: Medicaid Other | Attending: Obstetrics & Gynecology | Admitting: Obstetrics & Gynecology

## 2021-02-23 ENCOUNTER — Ambulatory Visit (HOSPITAL_BASED_OUTPATIENT_CLINIC_OR_DEPARTMENT_OTHER): Payer: Medicaid Other

## 2021-02-23 ENCOUNTER — Other Ambulatory Visit: Payer: Self-pay

## 2021-02-23 ENCOUNTER — Ambulatory Visit: Payer: Medicaid Other

## 2021-02-23 DIAGNOSIS — O133 Gestational [pregnancy-induced] hypertension without significant proteinuria, third trimester: Secondary | ICD-10-CM

## 2021-02-23 DIAGNOSIS — O43193 Other malformation of placenta, third trimester: Secondary | ICD-10-CM

## 2021-02-23 DIAGNOSIS — R109 Unspecified abdominal pain: Secondary | ICD-10-CM

## 2021-02-23 DIAGNOSIS — E669 Obesity, unspecified: Secondary | ICD-10-CM | POA: Diagnosis not present

## 2021-02-23 DIAGNOSIS — Z3A31 31 weeks gestation of pregnancy: Secondary | ICD-10-CM | POA: Insufficient documentation

## 2021-02-23 DIAGNOSIS — O99213 Obesity complicating pregnancy, third trimester: Secondary | ICD-10-CM | POA: Diagnosis not present

## 2021-02-23 DIAGNOSIS — Z362 Encounter for other antenatal screening follow-up: Secondary | ICD-10-CM | POA: Diagnosis present

## 2021-02-23 DIAGNOSIS — O24410 Gestational diabetes mellitus in pregnancy, diet controlled: Secondary | ICD-10-CM

## 2021-02-23 DIAGNOSIS — O365930 Maternal care for other known or suspected poor fetal growth, third trimester, not applicable or unspecified: Secondary | ICD-10-CM | POA: Diagnosis not present

## 2021-02-23 DIAGNOSIS — O24419 Gestational diabetes mellitus in pregnancy, unspecified control: Secondary | ICD-10-CM | POA: Insufficient documentation

## 2021-02-23 DIAGNOSIS — O139 Gestational [pregnancy-induced] hypertension without significant proteinuria, unspecified trimester: Secondary | ICD-10-CM | POA: Diagnosis not present

## 2021-02-23 LAB — COMPREHENSIVE METABOLIC PANEL WITH GFR
ALT: 14 U/L (ref 0–44)
AST: 16 U/L (ref 15–41)
Albumin: 3.1 g/dL — ABNORMAL LOW (ref 3.5–5.0)
Alkaline Phosphatase: 139 U/L — ABNORMAL HIGH (ref 38–126)
Anion gap: 9 (ref 5–15)
BUN: 6 mg/dL (ref 6–20)
CO2: 23 mmol/L (ref 22–32)
Calcium: 9.2 mg/dL (ref 8.9–10.3)
Chloride: 101 mmol/L (ref 98–111)
Creatinine, Ser: 0.53 mg/dL (ref 0.44–1.00)
GFR, Estimated: >60 mL/min
Glucose, Bld: 74 mg/dL (ref 70–99)
Potassium: 3.8 mmol/L (ref 3.5–5.1)
Sodium: 133 mmol/L — ABNORMAL LOW (ref 135–145)
Total Bilirubin: 0.5 mg/dL (ref 0.3–1.2)
Total Protein: 7 g/dL (ref 6.5–8.1)

## 2021-02-23 LAB — CBC
HCT: 36.9 % (ref 36.0–46.0)
Hemoglobin: 12.3 g/dL (ref 12.0–15.0)
MCH: 28.8 pg (ref 26.0–34.0)
MCHC: 33.3 g/dL (ref 30.0–36.0)
MCV: 86.4 fL (ref 80.0–100.0)
Platelets: 332 10*3/uL (ref 150–400)
RBC: 4.27 MIL/uL (ref 3.87–5.11)
RDW: 14 % (ref 11.5–15.5)
WBC: 6.8 10*3/uL (ref 4.0–10.5)
nRBC: 0 % (ref 0.0–0.2)

## 2021-02-23 LAB — PROTEIN / CREATININE RATIO, URINE
Creatinine, Urine: 163.8 mg/dL
Protein Creatinine Ratio: 0.08 mg/mg{Cre} (ref 0.00–0.15)
Total Protein, Urine: 13 mg/dL

## 2021-02-23 NOTE — Discharge Instructions (Signed)
Hypertension During Pregnancy High blood pressure (hypertension) is when the force of blood pumping through the arteries is high enough to cause problems with your health. Arteries are blood vessels that carry blood from the heart throughout the body. Hypertension during pregnancy can cause problems for you and your baby. It can be mild or severe. There are different types of hypertension that can happen during pregnancy. These include:  Chronic hypertension. This happens when you had high blood pressure before you became pregnant, and it continues during the pregnancy. Hypertension that develops before you are [redacted] weeks pregnant and continues during the pregnancy is also called chronic hypertension. If you have chronic hypertension, it will not go away after you have your baby. You will need follow-up visits with your health care provider after you have your baby. Your health care provider may want you to keep taking medicine for your blood pressure.  Gestational hypertension. This is hypertension that develops after the 20th week of pregnancy. Gestational hypertension usually goes away after you have your baby, but your health care provider will need to monitor your blood pressure to make sure that it is getting better.  Postpartum hypertension. This is high blood pressure that was present before delivery and continues after delivery or that starts after delivery. This usually occurs within 48 hours after childbirth but may occur up to 6 weeks after giving birth. When hypertension during pregnancy is severe, it is a medical emergency that requires treatment right away. How does this affect me? Women who have hypertension during pregnancy have a greater chance of developing hypertension later in life or during future pregnancies. In some cases, hypertension during pregnancy can cause serious complications, such as:  Stroke.  Heart attack.  Injury to other organs, such as kidneys, lungs, or  liver.  Preeclampsia.  A condition called hemolysis, elevated liver enzymes, and low platelet count (HELLP) syndrome.  Convulsions or seizures.  Placental abruption. How does this affect my baby? Hypertension during pregnancy can affect your baby. Your baby may:  Be born early (prematurely).  Not weigh as much as he or she should at birth (low birth weight).  Not tolerate labor well, leading to an unplanned cesarean delivery. This condition may also result in a baby's death before birth (stillbirth). What are the risks? There are certain factors that make it more likely for you to develop hypertension during pregnancy. These include:  Having hypertension during a previous pregnancy or a family history of hypertension.  Being overweight.  Being age 35 or older.  Being pregnant for the first time.  Being pregnant with more than one baby.  Becoming pregnant using fertilization methods, such as IVF (in vitro fertilization).  Having other medical problems, such as diabetes, kidney disease, or lupus. What can I do to lower my risk? The exact cause of hypertension during pregnancy is not known. You may be able to lower your risk by:  Maintaining a healthy weight.  Eating a healthy and balanced diet.  Following your health care provider's instructions about treating any long-term conditions that you had before becoming pregnant. It is very important to keep all of your prenatal care appointments. Your health care provider will check your blood pressure and make sure that your pregnancy is progressing as expected. If a problem is found, early treatment can prevent complications.   How is this treated? Treatment for hypertension during pregnancy varies depending on the type of hypertension you have and how serious it is.  If you were   taking medicine for high blood pressure before you became pregnant, talk with your health care provider. You may need to change medicine during  pregnancy because some medicines, like ACE inhibitors, may not be considered safe for your baby.  If you have gestational hypertension, your health care provider may order medicine to treat this during pregnancy.  If you are at risk for preeclampsia, your health care provider may recommend that you take a low-dose aspirin during your pregnancy.  If you have severe hypertension, you may need to be hospitalized so you and your baby can be monitored closely. You may also need to be given medicine to lower your blood pressure.  In some cases, if your condition gets worse, you may need to deliver your baby early. Follow these instructions at home: Eating and drinking  Drink enough fluid to keep your urine pale yellow.  Avoid caffeine.   Lifestyle  Do not use any products that contain nicotine or tobacco. These products include cigarettes, chewing tobacco, and vaping devices, such as e-cigarettes. If you need help quitting, ask your health care provider.  Do not use alcohol or drugs.  Avoid stress as much as possible.  Rest and get plenty of sleep.  Regular exercise can help to reduce your blood pressure. Ask your health care provider what kinds of exercise are best for you. General instructions  Take over-the-counter and prescription medicines only as told by your health care provider.  Keep all prenatal and follow-up visits. This is important. Contact a health care provider if:  You have symptoms that your health care provider told you may require more treatment or monitoring, such as: ? Headaches. ? Nausea or vomiting. ? Abdominal pain. ? Dizziness. ? Light-headedness. Get help right away if:  You have symptoms of serious complications, such as: ? Severe abdominal pain that does not get better with treatment. ? A severe headache that does not get better, blurred vision, or double vision. ? Vomiting that does not get better. ? Sudden, rapid weight gain or swelling in your  hands, ankles, or face. ? Vaginal bleeding. ? Blood in your urine. ? Shortness of breath or chest pain. ? Weakness on one side of your body or difficulty speaking.  Your baby is not moving as much as usual. These symptoms may represent a serious problem that is an emergency. Do not wait to see if the symptoms will go away. Get medical help right away. Call your local emergency services (911 in the [place] Do not drive yourself to the hospital. Summary  Hypertension during pregnancy can cause problems for you and your baby.  Treatment for hypertension during pregnancy varies depending on the type of hypertension you have and how serious it is.  Keep all prenatal and follow-up visits. This is important.  Get help right away if you have symptoms of serious complications related to high blood pressure. This information is not intended to replace advice given to you by your health care provider. Make sure you discuss any questions you have with your health care provider. Document Revised: 07/21/2020 Document Reviewed: 07/21/2020 Elsevier Patient Education  2021 Elsevier Inc.  

## 2021-02-23 NOTE — Progress Notes (Unsigned)
MFM consultation  Ms. Jacqueline Yu is a G2P0 who is here for antenatal testing given fetal growth restriction. Today the BPP was 8/8 with normal UA Dopplers ( previously abnormal) there was no evidence of AEDF or REDF.  Ms. Jacqueline Yu previous growth was EFW 3.1% with AC 2.9%  Today her blood pressure was 143/88 and repeat of 133/93 mmHg.  I discussed that given the FGR of 3% we would recommend continue weekly testing with UA Dopplers with repeat growth in 3-4 weeks. Secondly, in review of her blood pressure over time she meets the diagnosis of gestational hypertension.  She has not had recent labs and she is without s/sx of preeclampsia.  I discussed the diagnosis, evaluation and management of gestational hypertension and preeclampsia to include weekly NST and monitoring of labs and blood pressure. We would recommend delivery at 37 weeks for mild disease and 34 weeks for disease with severe features.  At this time I have asked Ms. Jacqueline Yu to go to [facility] for preeclampsia labs to include CBC, CMP and UPC.   She is scheduled for follow up in 1 week with growth.   I spent 15 minutes with > 50% in face to face consultation.  Novella Olive, MD.

## 2021-02-23 NOTE — [facility] Note (Signed)
Sent from MD office for BP evaluation.  Denies H/A, visual disturbances and epigastric pain.  Denies VB or LOF.  Endorses +FM.

## 2021-02-23 NOTE — Progress Notes (Signed)
Dr. Grace Bushy has recommended that patient go to [facility] for further assessment of elevated BP. [facility] notified. Patient and family agree with plan.

## 2021-02-23 NOTE — MAU Provider Note (Signed)
History     [id number]  Arrival date and time: 02/23/21 1638    Chief Complaint  Patient presents with  . BP Evaluation     HPI Jacqueline Yu is a 20 y.o. at 49w6dby LMP with PMHx notable for A1DM & IUGR, who presents for hypertension evaluation. Was seen in MFM today & had 2 elevated BPs. Patient has had elevated BPs on 2 previous visits. Denies headache, visual disturbance, or epigastric pain.   Vaginal bleeding: No LOF: No Fetal Movement: Yes Contractions: No  O/Positive/-- (10/05 0927)  OB History    Gravida  2   Para  0   Term  0   Preterm  0   AB  1   Living  0     SAB  1   IAB      Ectopic      Multiple      Live Births              Past Medical History:  Diagnosis Date  . Gestational diabetes   . Obesity     Past Surgical History:  Procedure Laterality Date  . NO PAST SURGERIES      Family History  Problem Relation Age of Onset  . Hypertension Mother   . Hyperlipidemia Mother   . Migraines Mother   . Anxiety disorder Mother   . Schizophrenia Brother   . Depression Brother   . Anxiety disorder Brother   . Schizophrenia Maternal Grandmother   . Depression Maternal Grandmother   . Anxiety disorder Maternal Grandmother     Social History   Socioeconomic History  . Marital status: Single    Spouse name: Not on file  . Number of children: Not on file  . Years of education: Not on file  . Highest education level: Not on file  Occupational History  . Not on file  Tobacco Use  . Smoking status: Never Smoker  . Smokeless tobacco: Never Used  Vaping Use  . Vaping Use: Never used  Substance and Sexual Activity  . Alcohol use: Not Currently  . Drug use: Not Currently    Types: Marijuana  . Sexual activity: Yes    Partners: Male    Birth control/protection: None  Other Topics Concern  . Not on file  Social History Narrative   Currently getting criminal justice degree    Boyfriend is named ADispensing optician   Social Determinants  of HRadio broadcast assistantStrain: Not on fComcastInsecurity: Not on file  Transportation Needs: Not on file  Physical Activity: Not on file  Stress: Not on file  Social Connections: Not on file  Intimate Partner Violence: Not on file    Allergies  Allergen Reactions  . Peanuts [Peanut Oil]     Scratchy throat    No current facility-administered medications on file prior to encounter.   Current Outpatient Medications on File Prior to Encounter  Medication Sig Dispense Refill  . Accu-Chek Softclix Lancets lancets Use as instructed 100 each 12  . Blood Glucose Monitoring Suppl (ACCU-CHEK GUIDE) w/Device KIT Use to check blood glucose four times daily, keep log of values 1 kit 0  . Blood Pressure Monitoring (BLOOD PRESSURE KIT) DEVI Check blood pressure readings at home regularly o09.92 1 each 0  . glucose blood test strip Use as instructed 100 each 12  . Misc. Devices (GOJJI WEIGHT SCALE) MISC 1 Device by Does not apply route as needed. 1 each 0  .  Prenatal Vit-Fe Fumarate-FA (MULTIVITAMIN-PRENATAL) 27-0.8 MG TABS tablet Take 1 tablet by mouth daily at 12 noon.    Marland Kitchen acetaminophen (TYLENOL) 500 MG tablet Take 2 tablets (1,000 mg total) by mouth every 8 (eight) hours as needed for headache. (Patient not taking: No sig reported) 100 tablet 0     ROS Pertinent positives and negative per HPI, all others reviewed and negative  Physical Exam   BP 133/87 (BP Location: Right Arm)   Pulse 92   Temp 98 F (36.7 C) (Oral)   Resp 16   Ht _0  (1.626 m)   Wt 100.7 kg   LMP 07/15/2020   SpO2 100%   BMI 38.12 kg/m   Patient Vitals for the past 24 hrs:  BP Temp Temp src Pulse Resp SpO2 Height Weight  02/23/21 1931 133/87 -- -- 92 16 100 % -- --  02/23/21 1916 124/76 -- -- 85 -- -- -- --  02/23/21 1900 130/74 -- -- 83 -- 100 % -- --  02/23/21 1846 115/71 -- -- 85 -- -- -- --  02/23/21 1831 118/73 -- -- 95 18 -- -- --  02/23/21 1801 126/76 -- -- 78 18 -- -- --  02/23/21  1746 127/78 -- -- 74 18 -- -- --  02/23/21 1730 135/78 -- -- (!) 103 18 -- -- --  02/23/21 1717 (!) 135/93 98 F (36.7 C) Oral 83 20 100 % -- --  02/23/21 1712 -- -- -- -- -- -- _1  (1.626 m) 100.7 kg    Physical Exam Vitals and nursing note reviewed.  Constitutional:      General: She is not in acute distress.    Appearance: Normal appearance.  HENT:     Head: Normocephalic and atraumatic.  Cardiovascular:     Rate and Rhythm: Normal rate.     Heart sounds: Normal heart sounds.  Pulmonary:     Effort: Pulmonary effort is normal. No respiratory distress.  Musculoskeletal:     Right lower leg: No edema.     Left lower leg: No edema.  Skin:    General: Skin is warm and dry.  Neurological:     Mental Status: She is alert.     Deep Tendon Reflexes:     Reflex Scores:      Patellar reflexes are 2+ on the right side and 2+ on the left side.    Comments: No clonus  Psychiatric:        Mood and Affect: Mood normal.        Behavior: Behavior normal.       FHT Baseline 135, moderate variability, 15x15 accels, no decels Toco: none Cat: 1  Labs Results for orders placed or performed during the hospital encounter of 02/23/21 (from the past 24 hour(s))  Protein / creatinine ratio, urine     Status: None   Collection Time: 02/23/21  5:11 PM  Result Value Ref Range   Creatinine, Urine 163.80 mg/dL   Total Protein, Urine 13 mg/dL   Protein Creatinine Ratio 0.08 0.00 - 0.15 mg/mg[Cre]  CBC     Status: None   Collection Time: 02/23/21  5:36 PM  Result Value Ref Range   WBC 6.8 4.0 - 10.5 K/uL   RBC 4.27 3.87 - 5.11 MIL/uL   Hemoglobin 12.3 12.0 - 15.0 g/dL   HCT 36.9 36.0 - 46.0 %   MCV 86.4 80.0 - 100.0 fL   MCH 28.8 26.0 - 34.0 pg   MCHC 33.3 30.0 -  36.0 g/dL   RDW 14.0 11.5 - 15.5 %   Platelets 332 150 - 400 K/uL   nRBC 0.0 0.0 - 0.2 %  Comprehensive metabolic panel     Status: Abnormal   Collection Time: 02/23/21  5:36 PM  Result Value Ref Range   Sodium 133 (L)  135 - 145 mmol/L   Potassium 3.8 3.5 - 5.1 mmol/L   Chloride 101 98 - 111 mmol/L   CO2 23 22 - 32 mmol/L   Glucose, Bld 74 70 - 99 mg/dL   BUN 6 6 - 20 mg/dL   Creatinine, Ser 0.53 0.44 - 1.00 mg/dL   Calcium 9.2 8.9 - 10.3 mg/dL   Total Protein 7.0 6.5 - 8.1 g/dL   Albumin 3.1 (L) 3.5 - 5.0 g/dL   AST 16 15 - 41 U/L   ALT 14 0 - 44 U/L   Alkaline Phosphatase 139 (H) 38 - 126 U/L   Total Bilirubin 0.5 0.3 - 1.2 mg/dL   GFR, Estimated >60 >60 mL/min   Anion gap 9 5 - 15    Imaging [place] MFM FETAL BPP WO NON STRESS  Result Date: 02/23/2021 ----------------------------------------------------------------------  OBSTETRICS REPORT                       (Signed Final 02/23/2021 05:56 pm) ---------------------------------------------------------------------- Patient Info  ID #:       [id number]                          D.O.B.:  11-Dec-2000 (20 yrs)  Name:       Jacqueline Yu                     Visit Date: 02/23/2021 02:40 pm ---------------------------------------------------------------------- Performed By  Attending:        Sander Nephew      Ref. Address:     [address] [place]  [place]  Performed By:     Rodrigo Ran BS      [facility]                                 [facility] at                                                             [place] [facility]                                                             [facility]  Referred By:      [place] ---------------------------------------------------------------------- Orders  #  Description                           Code        Ordered By  1  [place] MFM FETAL BPP WO NON               76819.01    [place]  2  [place] MFM UA CORD DOPPLER                03212.24    Sander Nephew ----------------------------------------------------------------------  #  Order #                     Accession #                Episode #  1  [id number]                   [id number]                 [id number]  2  [id number]                   [id number]                 [id number] ---------------------------------------------------------------------- Indications  [redacted] weeks gestation of pregnancy                Z3A.31  Maternal care for known or suspected poor      O36.5930  fetal growth, third trimester, not applicable or  unspecified IUGR  Obesity complicating pregnancy, third  Q73.419  trimester (BMI 39)  Gestational diabetes in pregnancy, diet        O24.410  controlled  Quad - neg  Marginal insertion of umbilical cord affecting O43.193  management of mother in third trimester  Encounter for other antenatal screening        Z36.2  follow-up ---------------------------------------------------------------------- Fetal Evaluation  Num Of Fetuses:         1  Fetal Heart Rate(bpm):  141  Cardiac Activity:       Observed  Presentation:           Cephalic  Amniotic Fluid  AFI FV:      Within normal limits  AFI Sum(cm)     %Tile       Largest Pocket(cm)  15.2            54          5.8  RUQ(cm)       RLQ(cm)       LUQ(cm)        LLQ(cm)  5.8           1.8           3.4            4.2 ---------------------------------------------------------------------- Biophysical Evaluation  Amniotic F.V:   Within normal limits       F. Tone:        Observed  F. Movement:    Observed                   Score:          8/8  F. Breathing:   Observed ---------------------------------------------------------------------- OB History  Gravidity:    2         Term:   0         SAB:   1  Living:       0 ----------------------------------------------------------------------  Gestational Age  LMP:           31w 6d        Date:  07/15/20                 EDD:   04/21/21  Best:          31w 6d     Det. By:  LMP  (07/15/20)          EDD:   04/21/21 ---------------------------------------------------------------------- Doppler - Fetal Vessels  Umbilical Artery   S/D     %tile                                              ADFV    RDFV    3.7       92                                                 No      No ---------------------------------------------------------------------- Impression  Ms. Montoro is a G2P0 who is here for antenatal testing given  fetal growth restriction.  Today the BPP was 8/8 with normal UA Dopplers ( previously  abnormal) there was no evidence of AEDF or REDF.  Ms. Kontos previous growth was EFW 3.1% with AC 2.9%  Today her blood pressure was  143/88 and repeat of 133/93  mmHg.  I discussed that given the FGR of 3% we would recommend  continue weekly testing with UA Dopplers with repeat growth  in 3-4 weeks.  Secondly, in review of her blood pressure over time she  meets the diagnosis of gestational hypertension.  She has not had recent labs and she is without s/sx of  preeclampsia.  I discussed the diagnosis, evaluation and management of  gestational hypertension and preeclampsia to include weekly  NST and monitoring of labs and blood pressure.  We would recommend delivery at 37 weeks for mild disease  and 34 weeks for disease with severe features.  At this time I have asked Ms. Heino to go to MAU for  preeclampsia labs to include CBC, CMP and UPC.  She is scheduled for follow up in 1 week with growth. ---------------------------------------------------------------------- Recommendations  Repeat growth and testing including UA dopplers in 1 week.  To MAU for CBC, CMP and UPC. ----------------------------------------------------------------------               Sander Nephew, MD Electronically Signed Final Report   02/23/2021 05:56 pm  ----------------------------------------------------------------------  [place] MFM UA CORD DOPPLER  Result Date: 02/23/2021 ----------------------------------------------------------------------  OBSTETRICS REPORT                       (Signed Final 02/23/2021 05:56 pm) ---------------------------------------------------------------------- Patient Info  ID #:       [id number]                          D.O.B.:  10/09/2001 (20 yrs)  Name:       Jacqueline Yu                     Visit Date: 02/23/2021 02:40 pm ---------------------------------------------------------------------- Performed By  Attending:        Sander Nephew      Ref. Address:     [address] [place]  [place]  Performed By:     Rodrigo Ran BS      Location[facility]         [facility]                    [facility]                                 [facility] at                                                             [place] [facility]                                                             Women  Referred By:      [place] ---------------------------------------------------------------------- Orders  #  Description                           Code        Ordered By  1  [place] MFM FETAL BPP WO NON               76819.01    [place]  2  [place] MFM UA CORD DOPPLER                47425.95    Sander Nephew ----------------------------------------------------------------------  #  Order #                     Accession #                Episode #  1  [id number]                   [id number]                 [id number]  2  [id number]                   [id number]                  [id number] ---------------------------------------------------------------------- Indications  [redacted] weeks gestation of pregnancy                Z3A.31  Maternal care for known or suspected poor      O36.5930  fetal growth, third trimester, not applicable or  unspecified IUGR  Obesity complicating pregnancy, third  W54.627  trimester (BMI 39)  Gestational diabetes in pregnancy, diet        O24.410  controlled  Quad - neg  Marginal insertion of umbilical cord affecting O43.193  management of mother in third trimester  Encounter for other antenatal screening        Z36.2  follow-up ---------------------------------------------------------------------- Fetal Evaluation  Num Of Fetuses:         1  Fetal Heart Rate(bpm):  141  Cardiac Activity:       Observed  Presentation:           Cephalic  Amniotic Fluid  AFI FV:      Within normal limits  AFI Sum(cm)     %Tile       Largest Pocket(cm)  15.2            54          5.8  RUQ(cm)       RLQ(cm)       LUQ(cm)        LLQ(cm)  5.8           1.8           3.4            4.2 ---------------------------------------------------------------------- Biophysical Evaluation  Amniotic F.V:   Within normal limits       F. Tone:        Observed  F. Movement:    Observed                   Score:          8/8  F. Breathing:   Observed ---------------------------------------------------------------------- OB History  Gravidity:    2         Term:   0         SAB:   1  Living:       0 ---------------------------------------------------------------------- Gestational Age  LMP:           31w 6d        Date:  07/15/20                 EDD:   04/21/21  Best:          31w 6d     Det. By:  LMP  (07/15/20)          EDD:   04/21/21 ---------------------------------------------------------------------- Doppler - Fetal Vessels  Umbilical Artery   S/D     %tile                                              ADFV    RDFV    3.7       92                                                 No      No  ---------------------------------------------------------------------- Impression  Ms. Sarwar is a G2P0 who is here for antenatal testing given  fetal growth restriction.  Today the BPP was 8/8 with normal UA Dopplers ( previously  abnormal) there was no evidence of AEDF or REDF.  Ms. Najarro previous growth was EFW 3.1% with AC 2.9%  Today her blood pressure was  143/88 and repeat of 133/93  mmHg.  I discussed that given the FGR of 3% we would recommend  continue weekly testing with UA Dopplers with repeat growth  in 3-4 weeks.  Secondly, in review of her blood pressure over time she  meets the diagnosis of gestational hypertension.  She has not had recent labs and she is without s/sx of  preeclampsia.  I discussed the diagnosis, evaluation and management of  gestational hypertension and preeclampsia to include weekly  NST and monitoring of labs and blood pressure.  We would recommend delivery at 37 weeks for mild disease  and 34 weeks for disease with severe features.  At this time I have asked Ms. Strozier to go to MAU for  preeclampsia labs to include CBC, CMP and UPC.  She is scheduled for follow up in 1 week with growth. ---------------------------------------------------------------------- Recommendations  Repeat growth and testing including UA dopplers in 1 week.  To MAU for CBC, CMP and UPC. ----------------------------------------------------------------------               Sander Nephew, MD Electronically Signed Final Report   02/23/2021 05:56 pm ----------------------------------------------------------------------   MAU Course  Procedures  Lab Orders     CBC     Comprehensive metabolic panel     Protein / creatinine ratio, urine No orders of the defined types were placed in this encounter.  Imaging Orders  No imaging studies ordered today    MDM Intake BP in MAU is elevated. Repeat BPs are all normal. Patient is asymptomatic & has normal preeclampsia labs. The MAU elevated is her 4th  elevated BP in the last 2 weeks. Gestational hypertension added to her problem list & sticky note. She has an appointment with her ob on Monday & is being followed weekly by MFM due to IUGR.    Assessment and Plan   1. Gestational hypertension, third trimester   2. [redacted] weeks gestation of pregnancy    -given new diagnosis of gestational hypertension. Reviewed signs of preeclampsia & reasons to return to MAU  Jorje Guild, NP

## 2021-02-27 ENCOUNTER — Other Ambulatory Visit: Payer: Self-pay

## 2021-02-27 ENCOUNTER — Ambulatory Visit (INDEPENDENT_AMBULATORY_CARE_PROVIDER_SITE_OTHER): Payer: Medicaid Other | Admitting: Family Medicine

## 2021-02-27 ENCOUNTER — Ambulatory Visit: Payer: Medicaid Other

## 2021-02-27 ENCOUNTER — Other Ambulatory Visit: Payer: Self-pay | Admitting: *Deleted

## 2021-02-27 VITALS — BP 134/89 | HR 85 | Wt 228.0 lb

## 2021-02-27 DIAGNOSIS — O24913 Unspecified diabetes mellitus in pregnancy, third trimester: Secondary | ICD-10-CM

## 2021-02-27 DIAGNOSIS — O0990 Supervision of high risk pregnancy, unspecified, unspecified trimester: Secondary | ICD-10-CM

## 2021-02-27 DIAGNOSIS — R87610 Atypical squamous cells of undetermined significance on cytologic smear of cervix (ASC-US): Secondary | ICD-10-CM

## 2021-02-27 DIAGNOSIS — R87810 Cervical high risk human papillomavirus (HPV) DNA test positive: Secondary | ICD-10-CM

## 2021-02-27 DIAGNOSIS — O133 Gestational [pregnancy-induced] hypertension without significant proteinuria, third trimester: Secondary | ICD-10-CM

## 2021-02-27 DIAGNOSIS — O43199 Other malformation of placenta, unspecified trimester: Secondary | ICD-10-CM

## 2021-02-27 DIAGNOSIS — O365990 Maternal care for other known or suspected poor fetal growth, unspecified trimester, not applicable or unspecified: Secondary | ICD-10-CM

## 2021-02-27 NOTE — Progress Notes (Signed)
   PRENATAL VISIT NOTE  Subjective:  Jacqueline Yu is a 20 y.o. G2P0010 at [redacted]w[redacted]d being seen today for ongoing prenatal care.  She is currently monitored for the following issues for this high-risk pregnancy and has History of trauma ; ASCUS with positive high risk HPV cervical; Diabetes mellitus affecting pregnancy; Fetal cardiac echogenic focus; Marginal insertion of umbilical cord affecting management of mother; Right flank pain; Supervision of high risk pregnancy, antepartum; Pregnancy affected by fetal growth restriction; and Gestational hypertension on their problem list.  Patient reports no complaints.  Contractions: Irritability. Vag. Bleeding: None.  Movement: Present. Denies leaking of fluid.   The following portions of the patient's history were reviewed and updated as appropriate: allergies, current medications, past family history, past medical history, past social history, past surgical history and problem list.   Objective:   Vitals:   02/27/21 1538 02/27/21 1544  BP: (!) 143/93 134/89  Pulse: 84 85  Weight: 228 lb (103.4 kg)     Fetal Status:     Movement: Present     General:  Alert, oriented and cooperative. Patient is in no acute distress.  Skin: Skin is warm and dry. No rash noted.   Cardiovascular: Normal heart rate noted  Respiratory: Normal respiratory effort, no problems with respiration noted  Abdomen: Soft, gravid, appropriate for gestational age.  Pain/Pressure: Present     Pelvic: Cervical exam deferred        Extremities: Normal range of motion.  Edema: Mild pitting, slight indentation  Mental Status: Normal mood and affect. Normal behavior. Normal judgment and thought content.   Assessment and Plan:  Pregnancy: G2P0010 at [redacted]w[redacted]d 1. Supervision of high risk pregnancy, antepartum Doing well without complaints. Discussed conservative management for LE edema and round ligament pain. Taking PNV, encouraged to take bASA Discussed contraception, undecided IOL  at 37 weeks, sooner if preE or MFM recommends  2. Gestational hypertension, third trimester BP elevated today, patient asymptomatic. Will repeat preE labs.  3. Marginal insertion of umbilical cord affecting management of mother   4. Diabetes mellitus affecting pregnancy in third trimester BGL all within normal limits. Diet controlled. Encouraged to bring logs to all future visits as well.  5. ASCUS with positive high risk HPV cervical Repeat pap 09/2021  6. Pregnancy affected by fetal growth restriction 02/10/21 growth scan EFW 1189g, 3.1%ile, followed by MFM.   Preterm labor symptoms and general obstetric precautions including but not limited to vaginal bleeding, contractions, leaking of fluid and fetal movement were reviewed in detail with the patient. Please refer to After Visit Summary for other counseling recommendations.   Return in about 2 weeks (around 03/13/2021) for HROB; in person.  Future Appointments  Date Time Provider Department Center  03/03/2021  2:30 PM [facility] NURSE [facility]  03/03/2021  2:45 PM [facility] US6 [facility]  03/10/2021  2:30 PM [facility] NURSE [facility]  03/10/2021  2:45 PM [facility] US6 [facility]  03/16/2021  1:30 PM [facility] NURSE [facility]  03/16/2021  1:45 PM [facility] US5 [facility]  03/24/2021  2:30 PM [facility] NURSE [facility]  03/24/2021  2:45 PM [facility] US5 [facility]    Alric Seton, MD

## 2021-02-28 LAB — COMPREHENSIVE METABOLIC PANEL
ALT: 11 IU/L (ref 0–32)
AST: 15 IU/L (ref 0–40)
Albumin/Globulin Ratio: 1.1 — ABNORMAL LOW (ref 1.2–2.2)
Albumin: 3.6 g/dL — ABNORMAL LOW (ref 3.9–5.0)
Alkaline Phosphatase: 159 IU/L — ABNORMAL HIGH (ref 44–121)
BUN/Creatinine Ratio: 17 (ref 9–23)
BUN: 8 mg/dL (ref 6–20)
Bilirubin Total: <0.2 mg/dL (ref 0.0–1.2)
CO2: 20 mmol/L (ref 20–29)
Calcium: 9.8 mg/dL (ref 8.7–10.2)
Chloride: 104 mmol/L (ref 96–106)
Creatinine, Ser: 0.46 mg/dL — ABNORMAL LOW (ref 0.57–1.00)
Globulin, Total: 3.2 g/dL (ref 1.5–4.5)
Glucose: 90 mg/dL (ref 65–99)
Potassium: 4.2 mmol/L (ref 3.5–5.2)
Sodium: 140 mmol/L (ref 134–144)
Total Protein: 6.8 g/dL (ref 6.0–8.5)
eGFR: 139 mL/min/{1.73_m2} (ref >59)

## 2021-02-28 LAB — CBC
Hematocrit: 36.7 % (ref 34.0–46.6)
Hemoglobin: 11.5 g/dL (ref 11.1–15.9)
MCH: 27.2 pg (ref 26.6–33.0)
MCHC: 31.3 g/dL — ABNORMAL LOW (ref 31.5–35.7)
MCV: 87 fL (ref 79–97)
Platelets: 331 10*3/uL (ref 150–450)
RBC: 4.23 x10E6/uL (ref 3.77–5.28)
RDW: 13.3 % (ref 11.7–15.4)
WBC: 6.5 10*3/uL (ref 3.4–10.8)

## 2021-02-28 LAB — PROTEIN / CREATININE RATIO, URINE
Creatinine, Urine: 197.6 mg/dL
Protein, Ur: 39.5 mg/dL
Protein/Creat Ratio: 200 mg/g creat (ref 0–200)

## 2021-03-03 ENCOUNTER — Ambulatory Visit: Payer: Medicaid Other | Attending: Maternal & Fetal Medicine

## 2021-03-03 ENCOUNTER — Other Ambulatory Visit: Payer: Self-pay

## 2021-03-03 ENCOUNTER — Encounter: Payer: Self-pay | Admitting: *Deleted

## 2021-03-03 ENCOUNTER — Ambulatory Visit: Payer: Medicaid Other | Admitting: *Deleted

## 2021-03-03 ENCOUNTER — Other Ambulatory Visit: Payer: Self-pay | Admitting: Maternal & Fetal Medicine

## 2021-03-03 DIAGNOSIS — O99213 Obesity complicating pregnancy, third trimester: Secondary | ICD-10-CM

## 2021-03-03 DIAGNOSIS — O133 Gestational [pregnancy-induced] hypertension without significant proteinuria, third trimester: Secondary | ICD-10-CM

## 2021-03-03 DIAGNOSIS — Z3A33 33 weeks gestation of pregnancy: Secondary | ICD-10-CM

## 2021-03-03 DIAGNOSIS — R109 Unspecified abdominal pain: Secondary | ICD-10-CM | POA: Insufficient documentation

## 2021-03-03 DIAGNOSIS — O365930 Maternal care for other known or suspected poor fetal growth, third trimester, not applicable or unspecified: Secondary | ICD-10-CM | POA: Diagnosis not present

## 2021-03-03 DIAGNOSIS — E669 Obesity, unspecified: Secondary | ICD-10-CM

## 2021-03-03 DIAGNOSIS — O43193 Other malformation of placenta, third trimester: Secondary | ICD-10-CM

## 2021-03-03 DIAGNOSIS — O24410 Gestational diabetes mellitus in pregnancy, diet controlled: Secondary | ICD-10-CM | POA: Diagnosis not present

## 2021-03-10 ENCOUNTER — Encounter: Payer: Self-pay | Admitting: *Deleted

## 2021-03-10 ENCOUNTER — Ambulatory Visit: Payer: Medicaid Other | Attending: Maternal & Fetal Medicine

## 2021-03-10 ENCOUNTER — Other Ambulatory Visit: Payer: Self-pay

## 2021-03-10 ENCOUNTER — Ambulatory Visit: Payer: Medicaid Other | Admitting: *Deleted

## 2021-03-10 ENCOUNTER — Ambulatory Visit: Payer: Medicaid Other

## 2021-03-10 DIAGNOSIS — O43193 Other malformation of placenta, third trimester: Secondary | ICD-10-CM

## 2021-03-10 DIAGNOSIS — R109 Unspecified abdominal pain: Secondary | ICD-10-CM | POA: Diagnosis present

## 2021-03-10 DIAGNOSIS — O365930 Maternal care for other known or suspected poor fetal growth, third trimester, not applicable or unspecified: Secondary | ICD-10-CM

## 2021-03-10 DIAGNOSIS — Z3A34 34 weeks gestation of pregnancy: Secondary | ICD-10-CM

## 2021-03-10 DIAGNOSIS — E669 Obesity, unspecified: Secondary | ICD-10-CM

## 2021-03-10 DIAGNOSIS — O133 Gestational [pregnancy-induced] hypertension without significant proteinuria, third trimester: Secondary | ICD-10-CM | POA: Insufficient documentation

## 2021-03-10 DIAGNOSIS — O99213 Obesity complicating pregnancy, third trimester: Secondary | ICD-10-CM | POA: Diagnosis not present

## 2021-03-10 DIAGNOSIS — O24410 Gestational diabetes mellitus in pregnancy, diet controlled: Secondary | ICD-10-CM

## 2021-03-13 ENCOUNTER — Encounter (HOSPITAL_COMMUNITY): Payer: Self-pay | Admitting: Obstetrics & Gynecology

## 2021-03-13 ENCOUNTER — Ambulatory Visit: Payer: Medicaid Other

## 2021-03-13 ENCOUNTER — Ambulatory Visit (INDEPENDENT_AMBULATORY_CARE_PROVIDER_SITE_OTHER): Payer: Medicaid Other | Admitting: Obstetrics and Gynecology

## 2021-03-13 ENCOUNTER — Encounter: Payer: Self-pay | Admitting: Obstetrics and Gynecology

## 2021-03-13 ENCOUNTER — Other Ambulatory Visit: Payer: Self-pay

## 2021-03-13 ENCOUNTER — Inpatient Hospital Stay (HOSPITAL_COMMUNITY)
Admission: AD | Admit: 2021-03-13 | Discharge: 2021-03-13 | Disposition: A | Discharge status: Home / Self Care | Payer: Medicaid Other | Attending: Family Medicine | Admitting: Family Medicine

## 2021-03-13 VITALS — BP 159/102 | HR 91 | Wt 229.0 lb

## 2021-03-13 DIAGNOSIS — Z7982 Long term (current) use of aspirin: Secondary | ICD-10-CM | POA: Insufficient documentation

## 2021-03-13 DIAGNOSIS — O365990 Maternal care for other known or suspected poor fetal growth, unspecified trimester, not applicable or unspecified: Secondary | ICD-10-CM

## 2021-03-13 DIAGNOSIS — O26893 Other specified pregnancy related conditions, third trimester: Secondary | ICD-10-CM | POA: Insufficient documentation

## 2021-03-13 DIAGNOSIS — O133 Gestational [pregnancy-induced] hypertension without significant proteinuria, third trimester: Secondary | ICD-10-CM | POA: Insufficient documentation

## 2021-03-13 DIAGNOSIS — Z3A34 34 weeks gestation of pregnancy: Secondary | ICD-10-CM | POA: Diagnosis not present

## 2021-03-13 DIAGNOSIS — Z79899 Other long term (current) drug therapy: Secondary | ICD-10-CM | POA: Diagnosis not present

## 2021-03-13 DIAGNOSIS — O24913 Unspecified diabetes mellitus in pregnancy, third trimester: Secondary | ICD-10-CM

## 2021-03-13 DIAGNOSIS — O0990 Supervision of high risk pregnancy, unspecified, unspecified trimester: Secondary | ICD-10-CM

## 2021-03-13 DIAGNOSIS — M7989 Other specified soft tissue disorders: Secondary | ICD-10-CM | POA: Insufficient documentation

## 2021-03-13 DIAGNOSIS — R109 Unspecified abdominal pain: Secondary | ICD-10-CM | POA: Insufficient documentation

## 2021-03-13 LAB — COMPREHENSIVE METABOLIC PANEL
ALT: 18 U/L (ref 0–44)
AST: 22 U/L (ref 15–41)
Albumin: 2.8 g/dL — ABNORMAL LOW (ref 3.5–5.0)
Alkaline Phosphatase: 145 U/L — ABNORMAL HIGH (ref 38–126)
Anion gap: 7 (ref 5–15)
BUN: 8 mg/dL (ref 6–20)
CO2: 22 mmol/L (ref 22–32)
Calcium: 8.8 mg/dL — ABNORMAL LOW (ref 8.9–10.3)
Chloride: 104 mmol/L (ref 98–111)
Creatinine, Ser: 0.56 mg/dL (ref 0.44–1.00)
GFR, Estimated: >60 mL/min (ref >60)
Glucose, Bld: 80 mg/dL (ref 70–99)
Potassium: 3.6 mmol/L (ref 3.5–5.1)
Sodium: 133 mmol/L — ABNORMAL LOW (ref 135–145)
Total Bilirubin: 0.3 mg/dL (ref 0.3–1.2)
Total Protein: 6.3 g/dL — ABNORMAL LOW (ref 6.5–8.1)

## 2021-03-13 LAB — CBC
HCT: 33.1 % — ABNORMAL LOW (ref 36.0–46.0)
Hemoglobin: 10.7 g/dL — ABNORMAL LOW (ref 12.0–15.0)
MCH: 27.9 pg (ref 26.0–34.0)
MCHC: 32.3 g/dL (ref 30.0–36.0)
MCV: 86.4 fL (ref 80.0–100.0)
Platelets: 324 10*3/uL (ref 150–400)
RBC: 3.83 MIL/uL — ABNORMAL LOW (ref 3.87–5.11)
RDW: 14.2 % (ref 11.5–15.5)
WBC: 5.6 10*3/uL (ref 4.0–10.5)
nRBC: 0 % (ref 0.0–0.2)

## 2021-03-13 LAB — URINALYSIS, ROUTINE W REFLEX MICROSCOPIC
Bilirubin Urine: NEGATIVE
Glucose, UA: NEGATIVE mg/dL
Hgb urine dipstick: NEGATIVE
Ketones, ur: NEGATIVE mg/dL
Nitrite: NEGATIVE
Protein, ur: 100 mg/dL — AB
Specific Gravity, Urine: 1.025 (ref 1.005–1.030)
pH: 5 (ref 5.0–8.0)

## 2021-03-13 NOTE — MAU Provider Note (Signed)
History     CSN: [id number]  Arrival date and time: 03/13/21 1549   Event Date/Time   First Provider Initiated Contact with Patient 03/13/21 1636      Chief Complaint  Patient presents with  . Hypertension  . Foot Swelling   HPI 20yo G2P0 at 57w3dwith gestational diabetes and GHTN that was diagnosed  2 weeks ago. Was seen today in office and high almost severe range BP. Was sent here for evaluation. No HA, blurred vision, scotoma, abdominal pain, contractions, leaking fluid.  OB History    Gravida  2   Para  0   Term  0   Preterm  0   AB  1   Living  0     SAB  1   IAB      Ectopic      Multiple      Live Births              Past Medical History:  Diagnosis Date  . Gestational diabetes   . Obesity     Past Surgical History:  Procedure Laterality Date  . NO PAST SURGERIES      Family History  Problem Relation Age of Onset  . Hypertension Mother   . Hyperlipidemia Mother   . Migraines Mother   . Anxiety disorder Mother   . Schizophrenia Brother   . Depression Brother   . Anxiety disorder Brother   . Schizophrenia Maternal Grandmother   . Depression Maternal Grandmother   . Anxiety disorder Maternal Grandmother     Social History   Tobacco Use  . Smoking status: Never Smoker  . Smokeless tobacco: Never Used  Vaping Use  . Vaping Use: Never used  Substance Use Topics  . Alcohol use: Not Currently  . Drug use: Not Currently    Types: Marijuana    Allergies:  Allergies  Allergen Reactions  . Peanuts [Peanut Oil]     Scratchy throat    Medications Prior to Admission  Medication Sig Dispense Refill Last Dose  . aspirin EC 81 MG tablet Take 81 mg by mouth daily. Swallow whole.   03/12/2021 at Unknown time  . Blood Glucose Monitoring Suppl (ACCU-CHEK GUIDE) w/Device KIT Use to check blood glucose four times daily, keep log of values 1 kit 0 03/12/2021  . Prenatal Vit-Fe Fumarate-FA (MULTIVITAMIN-PRENATAL) 27-0.8 MG TABS tablet Take 1  tablet by mouth daily at 12 noon.   03/12/2021 at Unknown time  . Accu-Chek Softclix Lancets lancets Use as instructed 100 each 12   . acetaminophen (TYLENOL) 500 MG tablet Take 2 tablets (1,000 mg total) by mouth every 8 (eight) hours as needed for headache. (Patient not taking: No sig reported) 100 tablet 0   . Blood Pressure Monitoring (BLOOD PRESSURE KIT) DEVI Check blood pressure readings at home regularly o09.92 1 each 0   . glucose blood test strip Use as instructed 100 each 12   . Misc. Devices (GOJJI WEIGHT SCALE) MISC 1 Device by Does not apply route as needed. 1 each 0     Review of Systems Physical Exam   Blood pressure 136/89, pulse 85, temperature 98.5 F (36.9 C), temperature source Oral, resp. rate 17, last menstrual period 07/15/2020, SpO2 99 %.  Physical Exam Vitals reviewed. Exam conducted with a chaperone present.  Constitutional:      Appearance: Normal appearance.  Cardiovascular:     Rate and Rhythm: Normal rate and regular rhythm.     Pulses: Normal pulses.  Heart sounds: Normal heart sounds.  Pulmonary:     Effort: Pulmonary effort is normal.     Breath sounds: Normal breath sounds.  Abdominal:     General: Abdomen is flat.     Palpations: Abdomen is soft.  Musculoskeletal:     Cervical back: Normal range of motion.  Skin:    General: Skin is warm and dry.     Capillary Refill: Capillary refill takes less than 2 seconds.  Neurological:     General: No focal deficit present.     Mental Status: She is alert.  Psychiatric:        Mood and Affect: Mood normal.        Behavior: Behavior normal.        Thought Content: Thought content normal.        Judgment: Judgment normal.    Results for orders placed or performed during the hospital encounter of 03/13/21 (from the past 24 hour(s))  Urinalysis, Routine w reflex microscopic Urine, Clean Catch     Status: Abnormal   Collection Time: 03/13/21  4:12 PM  Result Value Ref Range   Color, Urine YELLOW  YELLOW   APPearance HAZY (A) CLEAR   Specific Gravity, Urine 1.025 1.005 - 1.030   pH 5.0 5.0 - 8.0   Glucose, UA NEGATIVE NEGATIVE mg/dL   Hgb urine dipstick NEGATIVE NEGATIVE   Bilirubin Urine NEGATIVE NEGATIVE   Ketones, ur NEGATIVE NEGATIVE mg/dL   Protein, ur 100 (A) NEGATIVE mg/dL   Nitrite NEGATIVE NEGATIVE   Leukocytes,Ua TRACE (A) NEGATIVE   RBC / HPF 0-5 0 - 5 RBC/hpf   WBC, UA 11-20 0 - 5 WBC/hpf   Bacteria, UA RARE (A) NONE SEEN   Squamous Epithelial / LPF 11-20 0 - 5   Mucus PRESENT    Hyaline Casts, UA PRESENT   CBC     Status: Abnormal   Collection Time: 03/13/21  4:40 PM  Result Value Ref Range   WBC 5.6 4.0 - 10.5 K/uL   RBC 3.83 (L) 3.87 - 5.11 MIL/uL   Hemoglobin 10.7 (L) 12.0 - 15.0 g/dL   HCT 33.1 (L) 36.0 - 46.0 %   MCV 86.4 80.0 - 100.0 fL   MCH 27.9 26.0 - 34.0 pg   MCHC 32.3 30.0 - 36.0 g/dL   RDW 14.2 11.5 - 15.5 %   Platelets 324 150 - 400 K/uL   nRBC 0.0 0.0 - 0.2 %  Comprehensive metabolic panel     Status: Abnormal   Collection Time: 03/13/21  4:40 PM  Result Value Ref Range   Sodium 133 (L) 135 - 145 mmol/L   Potassium 3.6 3.5 - 5.1 mmol/L   Chloride 104 98 - 111 mmol/L   CO2 22 22 - 32 mmol/L   Glucose, Bld 80 70 - 99 mg/dL   BUN 8 6 - 20 mg/dL   Creatinine, Ser 0.56 0.44 - 1.00 mg/dL   Calcium 8.8 (L) 8.9 - 10.3 mg/dL   Total Protein 6.3 (L) 6.5 - 8.1 g/dL   Albumin 2.8 (L) 3.5 - 5.0 g/dL   AST 22 15 - 41 U/L   ALT 18 0 - 44 U/L   Alkaline Phosphatase 145 (H) 38 - 126 U/L   Total Bilirubin 0.3 0.3 - 1.2 mg/dL   GFR, Estimated >60 >60 mL/min   Anion gap 7 5 - 15     MAU Course  Procedures NST: Baseline 130, mod variability, + accels, no decels  MDM  Assessment and Plan   1. [redacted] weeks gestation of pregnancy   2. Gestational hypertension, third trimester   3. Right flank pain    Discharge to home. Labs stable. NST reactive Truett Mainland 03/13/2021, 4:35 PM

## 2021-03-13 NOTE — MAU Note (Signed)
...  Jacqueline Yu is a 20 y.o. at [redacted]w[redacted]d here in MAU reporting: sent over from office for elevated BP. Patient denies HA, visual disturbances, RUQ pain, but endorses swelling of her feet. [place] and they have been swollen since. +FM. No VB or LOF.  Lab orders placed from triage:  UA

## 2021-03-13 NOTE — Discharge Instructions (Signed)
Hypertension During Pregnancy Hypertension is also called high blood pressure. High blood pressure means that the force of the blood moving in your body is high enough to cause problems for you and your baby. Different types of high blood pressure can happen during pregnancy. The types are:  High blood pressure before you got pregnant. This is called chronic hypertension.  This can continue during your pregnancy. Your doctor will want to keep checking your blood pressure. You may need medicine to control your blood pressure while you are pregnant. You will need follow-up visits after you have your baby.  High blood pressure that goes up during pregnancy when it was normal before. This is called gestational hypertension. It will often get better after you have your baby, but your doctor will need to watch your blood pressure to make sure that it is getting better.  You may develop high blood pressure after giving birth. This is called postpartum hypertension. This often occurs within 48 hours after childbirth but may occur up to 6 weeks after giving birth. Very high blood pressure during pregnancy is an emergency that needs treatment right away. How does this affect me? If you have high blood pressure during pregnancy, you have a higher chance of developing high blood pressure:  As you get older.  If you get pregnant again. In some cases, high blood pressure during pregnancy can cause:  Stroke.  Heart attack.  Damage to the kidneys, lungs, or liver.  Preeclampsia.  HELLP syndrome.  Seizures.  Problems with the placenta. How does this affect my baby? Your baby may:  Be born early.  Not weigh as much as he or she should.  Not handle labor well, leading to a C-section. This condition may also result in a baby's death before birth (stillbirth). What are the risks?  Having high blood pressure during a past pregnancy.  Being overweight.  Being age 35 or older.  Being pregnant  for the first time.  Being pregnant with more than one baby.  Becoming pregnant using fertility methods, such as IVF.  Having other problems, such as diabetes or kidney disease. What can I do to lower my risk?  Keep a healthy weight.  Eat a healthy diet.  Follow what your doctor tells you about treating any medical problems that you had before you got pregnant. It is very important to go to all of your doctor visits. Your doctor will check your blood pressure and make sure that your pregnancy is progressing as it should. Treatment should start early if a problem is found.   How is this treated? Treatment for high blood pressure during pregnancy can vary. It depends on the type of high blood pressure you have and how serious it is.  If you were taking medicine for your blood pressure before you got pregnant, talk with your doctor. You may need to change the medicine during pregnancy if it is not safe for your baby.  If your blood pressure goes up during pregnancy, your doctor may order medicine to treat this.  If you are at risk for preeclampsia, your doctor may tell you to take a low-dose aspirin while you are pregnant.  If you have very high blood pressure, you may need to stay in the hospital so you and your baby can be watched closely. You may also need to take medicine to lower your blood pressure.  In some cases, if your condition gets worse, you may need to have your baby early.   Follow these instructions at home: Eating and drinking  Drink enough fluid to keep your pee (urine) pale yellow.  Avoid caffeine.   Lifestyle  Do not smoke or use any products that contain nicotine or tobacco. If you need help quitting, ask your doctor.  Do not use alcohol or drugs.  Avoid stress.  Rest and get plenty of sleep.  Regular exercise can help. Ask your doctor what kinds of exercise are best for you. General instructions  Take over-the-counter and prescription medicines only as  told by your doctor.  Keep all prenatal and follow-up visits. Contact a doctor if:  You have symptoms that your doctor told you to watch for, such as: ? Headaches. ? A feeling like you may vomit (nausea). ? Vomiting. ? Belly (abdominal) pain. ? Feeling dizzy or light-headed. Get help right away if:  You have symptoms of serious problems, such as: ? Very bad belly pain that does not get better with treatment. ? A very bad headache that does not get better. ? Blurry vision. ? Double vision. ? Vomiting that does not get better. ? Sudden, fast weight gain. ? Sudden swelling in your hands, ankles, or face. ? Bleeding from your vagina. ? Blood in your pee. ? Shortness of breath. ? Chest pain. ? Weakness on one side of your body. ? Trouble talking.  Your baby is not moving as much as usual. These symptoms may be an emergency. Get help right away. Call your local emergency services (911 in the [place]  Do not wait to see if the symptoms will go away.  Do not drive yourself to the hospital. Summary  High blood pressure is also called hypertension.  High blood pressure means that the force of the blood moving in your body is high enough to cause problems for you and your baby.  Get help right away if you have symptoms of serious problems due to high blood pressure.  Keep all prenatal and follow-up visits. This information is not intended to replace advice given to you by your health care provider. Make sure you discuss any questions you have with your health care provider. Document Revised: 07/21/2020 Document Reviewed: 07/21/2020 Elsevier Patient Education  2021 Elsevier Inc.  
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
## 2021-03-13 NOTE — Progress Notes (Signed)
ROB [redacted]w[redacted]d Pt states she brought blood sugar log today.  CC: None   B/P elevated today pt denies any HA's , no visual changes.  Repeated B/P: 147/96    P: 87

## 2021-03-16 ENCOUNTER — Other Ambulatory Visit: Payer: Self-pay

## 2021-03-16 ENCOUNTER — Other Ambulatory Visit: Payer: Self-pay | Admitting: Maternal & Fetal Medicine

## 2021-03-16 ENCOUNTER — Encounter: Payer: Self-pay | Admitting: *Deleted

## 2021-03-16 ENCOUNTER — Ambulatory Visit: Payer: Medicaid Other | Admitting: *Deleted

## 2021-03-16 ENCOUNTER — Ambulatory Visit: Payer: Medicaid Other | Attending: Obstetrics and Gynecology

## 2021-03-16 VITALS — BP 154/96 | HR 83

## 2021-03-16 DIAGNOSIS — O24410 Gestational diabetes mellitus in pregnancy, diet controlled: Secondary | ICD-10-CM

## 2021-03-16 DIAGNOSIS — O43193 Other malformation of placenta, third trimester: Secondary | ICD-10-CM

## 2021-03-16 DIAGNOSIS — O99213 Obesity complicating pregnancy, third trimester: Secondary | ICD-10-CM | POA: Diagnosis not present

## 2021-03-16 DIAGNOSIS — O365930 Maternal care for other known or suspected poor fetal growth, third trimester, not applicable or unspecified: Secondary | ICD-10-CM | POA: Diagnosis present

## 2021-03-16 DIAGNOSIS — R109 Unspecified abdominal pain: Secondary | ICD-10-CM | POA: Diagnosis present

## 2021-03-16 DIAGNOSIS — O133 Gestational [pregnancy-induced] hypertension without significant proteinuria, third trimester: Secondary | ICD-10-CM

## 2021-03-16 DIAGNOSIS — Z3A34 34 weeks gestation of pregnancy: Secondary | ICD-10-CM

## 2021-03-16 DIAGNOSIS — E669 Obesity, unspecified: Secondary | ICD-10-CM

## 2021-03-16 NOTE — Procedures (Signed)
Jacqueline Yu [birth year] [redacted]w[redacted]d  Fetus A Non-Stress Test Interpretation for 03/16/21  Indication: IUGR  Fetal Heart Rate A Mode: External Baseline Rate (A): 140 bpm Variability: Moderate Accelerations: 15 x 15 Decelerations: None Multiple birth?: No  Uterine Activity Mode: Palpation,Toco Contraction Frequency (min): none Resting Tone Palpated: Relaxed Resting Time: Adequate  Interpretation (Fetal Testing) Nonstress Test Interpretation: Reactive Overall Impression: Reassuring for gestational age Comments: Dr. Judeth Cornfield reviewed tracing

## 2021-03-17 ENCOUNTER — Encounter (HOSPITAL_COMMUNITY): Payer: Self-pay | Admitting: *Deleted

## 2021-03-17 ENCOUNTER — Telehealth (HOSPITAL_COMMUNITY): Payer: Self-pay | Admitting: *Deleted

## 2021-03-17 NOTE — Telephone Encounter (Signed)
Preadmission screen  

## 2021-03-20 ENCOUNTER — Ambulatory Visit: Payer: Medicaid Other

## 2021-03-24 ENCOUNTER — Ambulatory Visit: Payer: Medicaid Other | Admitting: *Deleted

## 2021-03-24 ENCOUNTER — Ambulatory Visit (HOSPITAL_BASED_OUTPATIENT_CLINIC_OR_DEPARTMENT_OTHER): Payer: Medicaid Other

## 2021-03-24 ENCOUNTER — Inpatient Hospital Stay (HOSPITAL_COMMUNITY)
Admission: AD | Admit: 2021-03-24 | Discharge: 2021-03-30 | DRG: 807 | Disposition: A | Discharge status: Home / Self Care | Payer: Medicaid Other | Attending: Obstetrics and Gynecology | Admitting: Obstetrics and Gynecology

## 2021-03-24 ENCOUNTER — Other Ambulatory Visit: Payer: Self-pay

## 2021-03-24 ENCOUNTER — Ambulatory Visit: Payer: Medicaid Other

## 2021-03-24 ENCOUNTER — Encounter (HOSPITAL_COMMUNITY): Payer: Self-pay | Admitting: Obstetrics & Gynecology

## 2021-03-24 ENCOUNTER — Encounter: Payer: Self-pay | Admitting: *Deleted

## 2021-03-24 DIAGNOSIS — O99213 Obesity complicating pregnancy, third trimester: Secondary | ICD-10-CM | POA: Diagnosis not present

## 2021-03-24 DIAGNOSIS — O1413 Severe pre-eclampsia, third trimester: Secondary | ICD-10-CM

## 2021-03-24 DIAGNOSIS — O43123 Velamentous insertion of umbilical cord, third trimester: Secondary | ICD-10-CM | POA: Diagnosis present

## 2021-03-24 DIAGNOSIS — O24420 Gestational diabetes mellitus in childbirth, diet controlled: Secondary | ICD-10-CM | POA: Diagnosis present

## 2021-03-24 DIAGNOSIS — O1414 Severe pre-eclampsia complicating childbirth: Secondary | ICD-10-CM | POA: Diagnosis present

## 2021-03-24 DIAGNOSIS — Z23 Encounter for immunization: Secondary | ICD-10-CM | POA: Diagnosis not present

## 2021-03-24 DIAGNOSIS — O133 Gestational [pregnancy-induced] hypertension without significant proteinuria, third trimester: Secondary | ICD-10-CM

## 2021-03-24 DIAGNOSIS — O365930 Maternal care for other known or suspected poor fetal growth, third trimester, not applicable or unspecified: Secondary | ICD-10-CM | POA: Diagnosis present

## 2021-03-24 DIAGNOSIS — R87810 Cervical high risk human papillomavirus (HPV) DNA test positive: Secondary | ICD-10-CM | POA: Diagnosis present

## 2021-03-24 DIAGNOSIS — IMO0002 Reserved for concepts with insufficient information to code with codable children: Secondary | ICD-10-CM | POA: Diagnosis present

## 2021-03-24 DIAGNOSIS — Z3A36 36 weeks gestation of pregnancy: Secondary | ICD-10-CM

## 2021-03-24 DIAGNOSIS — O24410 Gestational diabetes mellitus in pregnancy, diet controlled: Secondary | ICD-10-CM

## 2021-03-24 DIAGNOSIS — O358XX0 Maternal care for other (suspected) fetal abnormality and damage, not applicable or unspecified: Secondary | ICD-10-CM | POA: Diagnosis present

## 2021-03-24 DIAGNOSIS — O24919 Unspecified diabetes mellitus in pregnancy, unspecified trimester: Secondary | ICD-10-CM | POA: Diagnosis present

## 2021-03-24 DIAGNOSIS — E668 Other obesity: Secondary | ICD-10-CM

## 2021-03-24 DIAGNOSIS — Z3490 Encounter for supervision of normal pregnancy, unspecified, unspecified trimester: Secondary | ICD-10-CM | POA: Diagnosis present

## 2021-03-24 DIAGNOSIS — R109 Unspecified abdominal pain: Secondary | ICD-10-CM

## 2021-03-24 DIAGNOSIS — Z7982 Long term (current) use of aspirin: Secondary | ICD-10-CM | POA: Diagnosis not present

## 2021-03-24 DIAGNOSIS — O43193 Other malformation of placenta, third trimester: Secondary | ICD-10-CM

## 2021-03-24 DIAGNOSIS — O43199 Other malformation of placenta, unspecified trimester: Secondary | ICD-10-CM | POA: Diagnosis present

## 2021-03-24 DIAGNOSIS — O99214 Obesity complicating childbirth: Secondary | ICD-10-CM | POA: Diagnosis present

## 2021-03-24 DIAGNOSIS — E669 Obesity, unspecified: Secondary | ICD-10-CM | POA: Diagnosis not present

## 2021-03-24 DIAGNOSIS — Z8632 Personal history of gestational diabetes: Secondary | ICD-10-CM

## 2021-03-24 DIAGNOSIS — T7421XA Adult sexual abuse, confirmed, initial encounter: Secondary | ICD-10-CM | POA: Diagnosis present

## 2021-03-24 DIAGNOSIS — Z20822 Contact with and (suspected) exposure to covid-19: Secondary | ICD-10-CM | POA: Diagnosis present

## 2021-03-24 DIAGNOSIS — R87610 Atypical squamous cells of undetermined significance on cytologic smear of cervix (ASC-US): Secondary | ICD-10-CM | POA: Diagnosis present

## 2021-03-24 LAB — COMPREHENSIVE METABOLIC PANEL
ALT: 17 U/L (ref 0–44)
AST: 22 U/L (ref 15–41)
Albumin: 2.8 g/dL — ABNORMAL LOW (ref 3.5–5.0)
Alkaline Phosphatase: 143 U/L — ABNORMAL HIGH (ref 38–126)
Anion gap: 9 (ref 5–15)
BUN: 8 mg/dL (ref 6–20)
CO2: 23 mmol/L (ref 22–32)
Calcium: 9.2 mg/dL (ref 8.9–10.3)
Chloride: 104 mmol/L (ref 98–111)
Creatinine, Ser: 0.66 mg/dL (ref 0.44–1.00)
GFR, Estimated: >60 mL/min (ref >60)
Glucose, Bld: 88 mg/dL (ref 70–99)
Potassium: 3.7 mmol/L (ref 3.5–5.1)
Sodium: 136 mmol/L (ref 135–145)
Total Bilirubin: 0.5 mg/dL (ref 0.3–1.2)
Total Protein: 6.5 g/dL (ref 6.5–8.1)

## 2021-03-24 LAB — URINALYSIS, ROUTINE W REFLEX MICROSCOPIC
Bilirubin Urine: NEGATIVE
Glucose, UA: NEGATIVE mg/dL
Hgb urine dipstick: NEGATIVE
Ketones, ur: NEGATIVE mg/dL
Leukocytes,Ua: NEGATIVE
Nitrite: NEGATIVE
Protein, ur: >=300 mg/dL — AB
Specific Gravity, Urine: 1.023 (ref 1.005–1.030)
pH: 6 (ref 5.0–8.0)

## 2021-03-24 LAB — CBC
HCT: 36.1 % (ref 36.0–46.0)
Hemoglobin: 12.1 g/dL (ref 12.0–15.0)
MCH: 28.7 pg (ref 26.0–34.0)
MCHC: 33.5 g/dL (ref 30.0–36.0)
MCV: 85.5 fL (ref 80.0–100.0)
Platelets: 303 10*3/uL (ref 150–400)
RBC: 4.22 MIL/uL (ref 3.87–5.11)
RDW: 14.2 % (ref 11.5–15.5)
WBC: 6.8 10*3/uL (ref 4.0–10.5)
nRBC: 0 % (ref 0.0–0.2)

## 2021-03-24 LAB — TYPE AND SCREEN
ABO/RH(D): O POS
Antibody Screen: NEGATIVE

## 2021-03-24 LAB — PROTEIN / CREATININE RATIO, URINE
Creatinine, Urine: 217.57 mg/dL
Protein Creatinine Ratio: 6.34 mg/mg{Cre} — ABNORMAL HIGH (ref 0.00–0.15)
Total Protein, Urine: 1380 mg/dL

## 2021-03-24 LAB — RESP PANEL BY RT-PCR (FLU A&B, COVID) ARPGX2
Influenza A by PCR: NEGATIVE
Influenza B by PCR: NEGATIVE
SARS Coronavirus 2 by RT PCR: NEGATIVE

## 2021-03-24 LAB — GLUCOSE, CAPILLARY: Glucose-Capillary: 89 mg/dL (ref 70–99)

## 2021-03-24 MED ORDER — LIDOCAINE HCL (PF) 1 % IJ SOLN: 30.0000 mL | INTRAMUSCULAR | Status: DC | PRN

## 2021-03-24 MED ORDER — ONDANSETRON HCL 4 MG/2ML IJ SOLN: 4.0000 mg | Freq: Four times a day (QID) | INTRAMUSCULAR | Status: DC | PRN

## 2021-03-24 MED ORDER — LACTATED RINGERS IV SOLN: INTRAVENOUS | Status: DC

## 2021-03-24 MED ORDER — SOD CITRATE-CITRIC ACID 500-334 MG/5ML PO SOLN: 30.0000 mL | ORAL | Status: DC | PRN

## 2021-03-24 MED ORDER — LABETALOL HCL 5 MG/ML IV SOLN
80.0000 mg | INTRAVENOUS | Status: DC | PRN
  Filled 2021-03-24: qty 16

## 2021-03-24 MED ORDER — LACTATED RINGERS IV SOLN: 500.0000 mL | INTRAVENOUS | Status: DC | PRN

## 2021-03-24 MED ORDER — PENICILLIN G POT IN DEXTROSE 60000 UNIT/ML IV SOLN
3.0000 10*6.[IU] | INTRAVENOUS | Status: DC
  Administered 2021-03-25 – 2021-03-26 (×11): 3 10*6.[IU] via INTRAVENOUS
  Filled 2021-03-24 (×11): qty 50

## 2021-03-24 MED ORDER — MISOPROSTOL 50MCG HALF TABLET
50.0000 ug | ORAL_TABLET | ORAL | Status: DC
  Administered 2021-03-24 – 2021-03-25 (×4): 50 ug via BUCCAL
  Filled 2021-03-24 (×4): qty 1

## 2021-03-24 MED ORDER — ACETAMINOPHEN 325 MG PO TABS: 650.0000 mg | ORAL_TABLET | ORAL | Status: DC | PRN

## 2021-03-24 MED ORDER — LABETALOL HCL 5 MG/ML IV SOLN
20.0000 mg | INTRAVENOUS | Status: DC | PRN
  Administered 2021-03-24 – 2021-03-26 (×3): 20 mg via INTRAVENOUS
  Filled 2021-03-24 (×4): qty 4

## 2021-03-24 MED ORDER — OXYTOCIN BOLUS FROM INFUSION
333.0000 mL | Freq: Once | INTRAVENOUS | Status: AC
  Administered 2021-03-27: 333 mL via INTRAVENOUS

## 2021-03-24 MED ORDER — OXYTOCIN-SODIUM CHLORIDE 30-0.9 UT/500ML-% IV SOLN
2.5000 [IU]/h | INTRAVENOUS | Status: DC
  Administered 2021-03-27: 2.5 [IU]/h via INTRAVENOUS
  Filled 2021-03-24 (×2): qty 500

## 2021-03-24 MED ORDER — OXYTOCIN-SODIUM CHLORIDE 30-0.9 UT/500ML-% IV SOLN: 2.5000 [IU]/h | INTRAVENOUS | Status: DC

## 2021-03-24 MED ORDER — OXYCODONE-ACETAMINOPHEN 5-325 MG PO TABS: 2.0000 | ORAL_TABLET | ORAL | Status: DC | PRN

## 2021-03-24 MED ORDER — OXYCODONE-ACETAMINOPHEN 5-325 MG PO TABS: 1.0000 | ORAL_TABLET | ORAL | Status: DC | PRN

## 2021-03-24 MED ORDER — LACTATED RINGERS IV SOLN
500.0000 mL | INTRAVENOUS | Status: DC | PRN
  Administered 2021-03-26 (×2): 500 mL via INTRAVENOUS

## 2021-03-24 MED ORDER — MISOPROSTOL 50MCG HALF TABLET
ORAL_TABLET | ORAL | Status: AC
  Administered 2021-03-24: 50 ug via BUCCAL
  Filled 2021-03-24: qty 1

## 2021-03-24 MED ORDER — TERBUTALINE SULFATE 1 MG/ML IJ SOLN: 0.2500 mg | Freq: Once | INTRAMUSCULAR | Status: DC | PRN

## 2021-03-24 MED ORDER — LABETALOL HCL 5 MG/ML IV SOLN
INTRAVENOUS | Status: AC
  Administered 2021-03-24: 20 mg via INTRAVENOUS
  Filled 2021-03-24: qty 4

## 2021-03-24 MED ORDER — LABETALOL HCL 5 MG/ML IV SOLN
40.0000 mg | INTRAVENOUS | Status: DC | PRN
  Administered 2021-03-24 (×2): 40 mg via INTRAVENOUS
  Filled 2021-03-24 (×2): qty 8

## 2021-03-24 MED ORDER — HYDRALAZINE HCL 20 MG/ML IJ SOLN: 10.0000 mg | INTRAMUSCULAR | Status: DC | PRN

## 2021-03-24 MED ORDER — MAGNESIUM SULFATE 40 GM/1000ML IV SOLN
2.0000 g/h | INTRAVENOUS | Status: DC
Start: 2021-03-24 — End: 2021-03-27
  Administered 2021-03-25 – 2021-03-27 (×2): 2 g/h via INTRAVENOUS
  Filled 2021-03-24 (×4): qty 1000

## 2021-03-24 MED ORDER — MAGNESIUM SULFATE BOLUS VIA INFUSION
4.0000 g | Freq: Once | INTRAVENOUS | Status: AC
  Administered 2021-03-24: 4 g via INTRAVENOUS
  Filled 2021-03-24: qty 1000

## 2021-03-24 MED ORDER — SODIUM CHLORIDE 0.9 % IV SOLN
5.0000 10*6.[IU] | Freq: Once | INTRAVENOUS | Status: AC
  Administered 2021-03-24: 5 10*6.[IU] via INTRAVENOUS
  Filled 2021-03-24: qty 5

## 2021-03-24 MED ORDER — MISOPROSTOL 25 MCG QUARTER TABLET: 25.0000 ug | ORAL_TABLET | ORAL | Status: DC | PRN

## 2021-03-24 MED ORDER — OXYTOCIN BOLUS FROM INFUSION: 333.0000 mL | Freq: Once | INTRAVENOUS | Status: DC
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
## 2021-03-24 NOTE — Plan of Care (Signed)

## 2021-03-24 NOTE — MAU Note (Signed)
Presents stating was sent from MD office for BP evaluation.  Denies H/A, visual disturbances, or epigastric pain.  Reports +FM.  Denies VB or LOF.

## 2021-03-24 NOTE — Progress Notes (Signed)
Jacqueline Yu is a 20 y.o. G2P0010 at [redacted]w[redacted]d   Subjective: Denies headache, visual disturbances, RUQ/epigastric pain, new onset swelling or weight gain.  Objective: BP (!) 163/100   Pulse 92   Temp 97.6 F (36.4 C) (Axillary)   Resp 18   Ht 5\' 4"  (1.626 m)   Wt 106.8 kg   LMP 07/15/2020   SpO2 96%   BMI 40.41 kg/m  I/O last 3 completed shifts: In: 73.7 [I.V.:73.7] Out: -  No intake/output data recorded.  FHT:  FHR: 130 bpm, variability: moderate,  accelerations:  Present,  decelerations:  Absent UC:   irregular, uterine irritability SVE:   Dilation: 1 Effacement (%): Thick Station: Ballotable Exam by:: [email or web address] CNM  Labs: Lab Results  Component Value Date   WBC 6.8 03/24/2021   HGB 12.1 03/24/2021   HCT 36.1 03/24/2021   MCV 85.5 03/24/2021   PLT 303 03/24/2021    Assessment / Plan: --20 y.o. G2P0010 at [redacted]w[redacted]d  --IOL for Severe Preeclampsia --Cat I tracing --GBS unknown, preterm, will treat prophylactically  Labor --1cm/thick/cepahlic by suture --Foley balloon placed at 1840. Inflated to 30 mL per patient request/tolerance --Buccal Cytotec to be administered by RN  PEC --MgSO4 initiated in MAU --Breakthrough severe range bp x 1 while repositioning for foley placement. Dr. [redacted]w[redacted]d aware --Labetalol PRN if additional severe range  --P:Cr 6.34, CBC WNL, CMET not resulted   IUGR 3.6% per MFM assessment today  A1GDM Well controlled q 4 hour CBGs until active labor  Charlotta Newton, CNM 03/24/2021, 7:19 PM

## 2021-03-24 NOTE — Progress Notes (Signed)
Labor Progress Note Jacqueline Yu is a 20 y.o. G2P0010 at [redacted]w[redacted]d presented for IOL-preE w/SF. S: Doing well without complaints.  O:  BP (!) 145/93   Pulse 79   Temp 98.2 F (36.8 C) (Oral)   Resp 16   Ht 5\' 4"  (1.626 m)   Wt 106.8 kg   LMP 07/15/2020   SpO2 96%   BMI 40.41 kg/m  EFM: baseline 130bpm/mod variability/+accels/no decels Toco: quiet  CVE: Dilation: 1 Effacement (%): Thick Station: Ballotable Presentation: Vertex Exam by:: [email or web address] CNM   [name]: 20 y.o. G2P0010 109w0d presented for IOL-preE w/ SF. #IOL: s/p cyto x1. FB placed @1820 , initially only inflated to 30cc given patient did not tolerate, later inflated to 60cc. Balloon still firmly in place per RN. Will re-dose cyto 50 mcg buccal. #Pain: PRN, desires epidural #FWB: cat 1 #GBS unknown, PCR pending, PCN given preterm #preE w/ SF: asymptomatic. BP severe range, has been receiving labetalol per anti-htn protocol. On Mg. preE labs demonstrated p/c 6.24, otherwise LFTs and platelents wnl. Continue to monitor. #A1GDM: not checking BGL prior. q4 in latent labor and q2 in active labor. EFW 2171g, 3.6%ile. #marginal cord insert: placenta to pathology  , MD 11:13 PM

## 2021-03-25 DIAGNOSIS — Z8632 Personal history of gestational diabetes: Secondary | ICD-10-CM

## 2021-03-25 LAB — GLUCOSE, CAPILLARY
Glucose-Capillary: 102 mg/dL — ABNORMAL HIGH (ref 70–99)
Glucose-Capillary: 120 mg/dL — ABNORMAL HIGH (ref 70–99)
Glucose-Capillary: 124 mg/dL — ABNORMAL HIGH (ref 70–99)
Glucose-Capillary: 125 mg/dL — ABNORMAL HIGH (ref 70–99)
Glucose-Capillary: 86 mg/dL (ref 70–99)
Glucose-Capillary: 87 mg/dL (ref 70–99)
Glucose-Capillary: 97 mg/dL (ref 70–99)

## 2021-03-25 LAB — COMPREHENSIVE METABOLIC PANEL
ALT: 19 U/L (ref 0–44)
AST: 23 U/L (ref 15–41)
Albumin: 2.8 g/dL — ABNORMAL LOW (ref 3.5–5.0)
Alkaline Phosphatase: 167 U/L — ABNORMAL HIGH (ref 38–126)
Anion gap: 6 (ref 5–15)
BUN: 5 mg/dL — ABNORMAL LOW (ref 6–20)
CO2: 23 mmol/L (ref 22–32)
Calcium: 8 mg/dL — ABNORMAL LOW (ref 8.9–10.3)
Chloride: 103 mmol/L (ref 98–111)
Creatinine, Ser: 0.47 mg/dL (ref 0.44–1.00)
GFR, Estimated: >60 mL/min (ref >60)
Glucose, Bld: 84 mg/dL (ref 70–99)
Potassium: 3.9 mmol/L (ref 3.5–5.1)
Sodium: 132 mmol/L — ABNORMAL LOW (ref 135–145)
Total Bilirubin: 0.5 mg/dL (ref 0.3–1.2)
Total Protein: 6.6 g/dL (ref 6.5–8.1)

## 2021-03-25 LAB — CBC
HCT: 36.3 % (ref 36.0–46.0)
Hemoglobin: 12 g/dL (ref 12.0–15.0)
MCH: 28.2 pg (ref 26.0–34.0)
MCHC: 33.1 g/dL (ref 30.0–36.0)
MCV: 85.4 fL (ref 80.0–100.0)
Platelets: 326 10*3/uL (ref 150–400)
RBC: 4.25 MIL/uL (ref 3.87–5.11)
RDW: 14.4 % (ref 11.5–15.5)
WBC: 8.3 10*3/uL (ref 4.0–10.5)
nRBC: 0 % (ref 0.0–0.2)

## 2021-03-25 LAB — RPR: RPR Ser Ql: NONREACTIVE

## 2021-03-25 MED ORDER — OXYTOCIN-SODIUM CHLORIDE 30-0.9 UT/500ML-% IV SOLN
1.0000 m[IU]/min | INTRAVENOUS | Status: DC
  Administered 2021-03-25: 6 m[IU]/min via INTRAVENOUS
  Administered 2021-03-25: 2 m[IU]/min via INTRAVENOUS
  Administered 2021-03-26 (×2): 8 m[IU]/min via INTRAVENOUS
  Administered 2021-03-27: 2 m[IU]/min via INTRAVENOUS

## 2021-03-25 MED ORDER — MISOPROSTOL 25 MCG QUARTER TABLET
25.0000 ug | ORAL_TABLET | Freq: Once | ORAL | Status: AC
  Administered 2021-03-25: 25 ug via VAGINAL
  Filled 2021-03-25: qty 1

## 2021-03-25 MED ORDER — LABETALOL HCL 100 MG PO TABS: 100.0000 mg | ORAL_TABLET | Freq: Two times a day (BID) | ORAL | Status: DC

## 2021-03-25 MED ORDER — TERBUTALINE SULFATE 1 MG/ML IJ SOLN
0.2500 mg | Freq: Once | INTRAMUSCULAR | Status: AC | PRN
  Administered 2021-03-26: 0.25 mg via SUBCUTANEOUS

## 2021-03-25 NOTE — Progress Notes (Signed)
Labor Progress Note Jacqueline Yu is a 20 y.o. G2P0010 at [redacted]w[redacted]d presented for IOL-preE w/SF. S: Doing well without complaints.  O:  BP 137/90   Pulse 76   Temp 98.1 F (36.7 C) (Oral)   Resp 18   Ht 5\' 4"  (1.626 m)   Wt 106.8 kg   LMP 07/15/2020   SpO2 96%   BMI 40.41 kg/m  EFM: baseline 130bpm/mod variability/+accels/no decels Toco: difficult to trace  CVE: Dilation: 3.5 Effacement (%): 50 Cervical Position: Middle Station: -3 Presentation: Vertex Exam by:: [email or web address] Clemons RN   [name]: 20 y.o. G2P0010 [redacted]w[redacted]d presented for IOL-preE w/ SF. #IOL: s/p cyto x2. FB dislodged @0015 . Pitocin started @0335 , continue to titrate. AROM with next cervical exam if able. #Pain: PRN, desires epidural #FWB: cat 1 #GBS unknown, culture pending, PCN given preterm #preE w/ SF: asymptomatic. BP severe range, has been receiving labetalol per anti-htn protocol. On Mg. preE labs demonstrated p/c 6.24, otherwise LFTs and platelents wnl. Continue to monitor. #A1GDM: not checking BGL prior. q4 in latent labor and q2 in active labor. EFW 2171g, 3.6%ile. #marginal cord insert: placenta to pathology  , MD 6:14 AM

## 2021-03-25 NOTE — Progress Notes (Signed)
Labor Progress Note Jacqueline Yu is a 20 y.o. G2P0010 at [redacted]w[redacted]d presented for IOL for preeclampsia with severe features  S:  Patient sleeping  O:  BP (!) 139/93   Pulse 83   Temp 98.2 F (36.8 C) (Oral)   Resp 17   Ht 5\' 4"  (1.626 m)   Wt 106.8 kg   LMP 07/15/2020   SpO2 96%   BMI 40.41 kg/m   Fetal Tracing:  Baseline: 120 Variability: moderate Accels: 10x10 Decels: none  Toco: occasional uc's   CVE: Dilation: 3.5 Effacement (%): 60 Cervical Position: Posterior Station: -3 Presentation: Vertex Exam by:: [email or web address], CNM   [name]: 20 y.o. G2P0010 [redacted]w[redacted]d IOL preeclampsia with severe features #Labor: Progressing well. Continue cytotec #Pain: per patient request #FWB: Cat 1 #GBS: unknown, PCN  [redacted]w[redacted]d, CNM 2:32 PM

## 2021-03-25 NOTE — Progress Notes (Signed)
S: Doing well. Pt states she's well rested after dinner and a nap. Denies HA/BV/N/V and epigastric pain.   O: Vitals:   03/25/21 1730 03/25/21 1801 03/25/21 1831 03/25/21 1901  BP: (!) 145/92 (!) 149/97 127/70 123/71  Pulse: 90 79 89 94  Resp:  18  16  Temp:      TempSrc:      SpO2:      Weight:      Height:         FHT:  FHR: 135 bpm, variability: moderate,  accelerations:  Present,  decelerations:  Absent UC:   irregular, with UI  SVE:   Dilation: 3 Effacement (%): 60 Station: -3 Exam by:: Dorathy Daft, RN, Student CNM   A / P: IOL for PEC with sever features.  s/p 4g bolus. 2g maintenance.   -PIH labs stable.   -Continue to monitor for worsening sx.  Protracted early labor. Cervix remains unchanged.   -Plan for repeat cytotec. Recheck in 4 hours.  A1GDM-Diet controlled   -Stable status. Continue q4 CBGs.  Fetal Wellbeing:  Category I Pain Control:  Labor support without medications.   Anticipated MOD:  NSVD  Pakou Rainbow Danella Deis) Suzie Portela, BSN, RNC-OB  Student Nurse-Midwife   03/25/2021  8:11 PM

## 2021-03-25 NOTE — Progress Notes (Addendum)
S: Adriena resting comfortably in bed with no complaints at this time. Denies HA/BV/N/V/epigastric pain.   O: Today's Vitals   03/25/21 0900 03/25/21 0901 03/25/21 0915 03/25/21 0932  BP:  (!) 146/96  (!) 161/103  Pulse:  87  81  Resp:  18    Temp:   98.2 F (36.8 C)   TempSrc:   Oral   SpO2:      Weight:      Height:      PainSc: 0-No pain      FHT:  FHR: 125 bpm, variability: moderate,  accelerations:  Present,  decelerations:  Absent UC:   Irregular q4-5,  SVE:   Dilation: 3.5 Effacement (%): 50 Station: -3 Exam by:: [place] Clemons RN  Exam deferred. Pt not actively laboring.      A / P: PEC with Severe features- s/p 4g bolus. On Mag 2g maintanence   -R/p PIH labs this AM.      -Pt asymptomatic. Continue to monitor for worsening sx of PEC.  Protracted early labor. S/p 14 milli/units with persistent irregular contraptions.    -Plan to stop Pitocin and give buccal cytotec x1. Recheck in 4 hours. Pt agreeable to plan.   A1GDM- Diet controlled.   -Stable status. Plan for carb modified breakfast. Continue with q4 CBGs.  Fetal Wellbeing:  Category I Pain Control:  Labor support without medications. Pt may have an epidural when requested.  I/D: GBS unknown Prophylactic PCN x4 Anticipated MOD:  NSVD  Dallen Bunte Danella Deis) Suzie Portela, BSN, RNC-OB  Student Nurse-Midwife   03/25/2021  9:45 AM

## 2021-03-25 NOTE — Progress Notes (Signed)
Labor Progress Note Jacqueline Yu is a 20 y.o. G2P0010 at [redacted]w[redacted]d presented for IOL-preE w/SF. S: Doing well without complaints.  O:  BP (!) 153/95   Pulse 79   Temp 98.3 F (36.8 C) (Oral)   Resp 16   Ht 5\' 4"  (1.626 m)   Wt 106.8 kg   LMP 07/15/2020   SpO2 96%   BMI 40.41 kg/m  EFM: baseline 130bpm/mod variability/+accels/no decels Toco: quiet  CVE: Dilation: 3 Effacement (%): 60 Cervical Position: Posterior Station: -3 Presentation: Vertex Exam by:: Dr. [email or web address]   [name]: 20 y.o. G2P0010 [redacted]w[redacted]d presented for IOL-preE w/ SF. #IOL: IOL started 5/13 with cyto x2. FB dislodged @0015  on 5/14. Pitocin started @0335  5/14 and patient made minimal cervical change so pitocin was stopped at 0900 and cyto re-dosed x3. Exam overall unchanged. Given that all cytotec have been buccal will dose vaginal cytotec 25 mcg x1 and then start pitocin at next check. #Pain: PRN, desires epidural #FWB: cat 1 #GBS unknown, culture pending, PCN given preterm #preE w/ SF: asymptomatic. BP severe range, has been receiving labetalol per anti-htn protocol. On Mg. preE labs demonstrated p/c 6.24, otherwise LFTs and platelents wnl. Will repeat preE labs and obtain Mg level at this time. #A1GDM: not checking BGL prior. q4 in latent labor and q2 in active labor. EFW 2171g, 3.6%ile. #marginal cord insert: placenta to pathology  , MD 11:53 PM

## 2021-03-25 NOTE — Progress Notes (Signed)
Labor Progress Note Jacqueline Yu is a 20 y.o. G2P0010 at 101w0d presented for IOL-preE w/SF. S: Strip reviewed.  O:  BP (!) 143/90   Pulse 76   Temp 98.1 F (36.7 C) (Oral)   Resp 17   Ht 5\' 4"  (1.626 m)   Wt 106.8 kg   LMP 07/15/2020   SpO2 96%   BMI 40.41 kg/m  EFM: baseline 130bpm/mod variability/+accels/no decels Toco: quiet  CVE: Dilation: 3.5 Effacement (%): 50 Cervical Position: Middle Station: -3 Presentation: Vertex Exam by:: [email or web address] Clemons RN   [name]: 20 y.o. G2P0010 [redacted]w[redacted]d presented for IOL-preE w/ SF. #IOL: s/p cyto x2. FB dislodged @0015 . Pitocin started @0335 , continue to titrate. #Pain: PRN, desires epidural #FWB: cat 1 #GBS unknown, culture pending, PCN given preterm #preE w/ SF: asymptomatic. BP severe range, has been receiving labetalol per anti-htn protocol. On Mg. preE labs demonstrated p/c 6.24, otherwise LFTs and platelents wnl. Continue to monitor. #A1GDM: not checking BGL prior. q4 in latent labor and q2 in active labor. EFW 2171g, 3.6%ile. #marginal cord insert: placenta to pathology  , MD 4:25 AM

## 2021-03-26 ENCOUNTER — Inpatient Hospital Stay (HOSPITAL_COMMUNITY): Payer: Medicaid Other | Admitting: Anesthesiology

## 2021-03-26 LAB — CBC
HCT: 34.5 % — ABNORMAL LOW (ref 36.0–46.0)
HCT: 35.3 % — ABNORMAL LOW (ref 36.0–46.0)
Hemoglobin: 11.4 g/dL — ABNORMAL LOW (ref 12.0–15.0)
Hemoglobin: 11.6 g/dL — ABNORMAL LOW (ref 12.0–15.0)
MCH: 28.2 pg (ref 26.0–34.0)
MCH: 28.5 pg (ref 26.0–34.0)
MCHC: 32.9 g/dL (ref 30.0–36.0)
MCHC: 33 g/dL (ref 30.0–36.0)
MCV: 85.7 fL (ref 80.0–100.0)
MCV: 86.3 fL (ref 80.0–100.0)
Platelets: 286 10*3/uL (ref 150–400)
Platelets: 314 10*3/uL (ref 150–400)
RBC: 4 MIL/uL (ref 3.87–5.11)
RBC: 4.12 MIL/uL (ref 3.87–5.11)
RDW: 14.4 % (ref 11.5–15.5)
RDW: 14.6 % (ref 11.5–15.5)
WBC: 5.4 10*3/uL (ref 4.0–10.5)
WBC: 6.8 10*3/uL (ref 4.0–10.5)
nRBC: 0 % (ref 0.0–0.2)
nRBC: 0 % (ref 0.0–0.2)

## 2021-03-26 LAB — CULTURE, BETA STREP (GROUP B ONLY)

## 2021-03-26 LAB — GLUCOSE, CAPILLARY
Glucose-Capillary: 77 mg/dL (ref 70–99)
Glucose-Capillary: 82 mg/dL (ref 70–99)
Glucose-Capillary: 83 mg/dL (ref 70–99)
Glucose-Capillary: 88 mg/dL (ref 70–99)
Glucose-Capillary: 90 mg/dL (ref 70–99)
Glucose-Capillary: 97 mg/dL (ref 70–99)

## 2021-03-26 LAB — COMPREHENSIVE METABOLIC PANEL
ALT: 19 U/L (ref 0–44)
AST: 21 U/L (ref 15–41)
Albumin: 2.6 g/dL — ABNORMAL LOW (ref 3.5–5.0)
Alkaline Phosphatase: 153 U/L — ABNORMAL HIGH (ref 38–126)
Anion gap: 5 (ref 5–15)
BUN: 6 mg/dL (ref 6–20)
CO2: 25 mmol/L (ref 22–32)
Calcium: 7.8 mg/dL — ABNORMAL LOW (ref 8.9–10.3)
Chloride: 102 mmol/L (ref 98–111)
Creatinine, Ser: 0.5 mg/dL (ref 0.44–1.00)
GFR, Estimated: >60 mL/min (ref >60)
Glucose, Bld: 86 mg/dL (ref 70–99)
Potassium: 4.2 mmol/L (ref 3.5–5.1)
Sodium: 132 mmol/L — ABNORMAL LOW (ref 135–145)
Total Bilirubin: 0.4 mg/dL (ref 0.3–1.2)
Total Protein: 6.2 g/dL — ABNORMAL LOW (ref 6.5–8.1)

## 2021-03-26 LAB — OB RESULTS CONSOLE GBS: GBS: NEGATIVE

## 2021-03-26 LAB — MAGNESIUM: Magnesium: 5 mg/dL — ABNORMAL HIGH (ref 1.7–2.4)

## 2021-03-26 MED ORDER — LACTATED RINGERS IV SOLN
500.0000 mL | Freq: Once | INTRAVENOUS | Status: AC
  Administered 2021-03-26: 250 mL via INTRAVENOUS

## 2021-03-26 MED ORDER — MISOPROSTOL 50MCG HALF TABLET
50.0000 ug | ORAL_TABLET | ORAL | Status: DC | PRN
  Administered 2021-03-26: 50 ug via BUCCAL
  Filled 2021-03-26: qty 1

## 2021-03-26 MED ORDER — LABETALOL HCL 5 MG/ML IV SOLN
40.0000 mg | INTRAVENOUS | Status: DC | PRN
  Administered 2021-03-27 (×2): 40 mg via INTRAVENOUS
  Filled 2021-03-26 (×3): qty 8

## 2021-03-26 MED ORDER — HYDRALAZINE HCL 20 MG/ML IJ SOLN: 10.0000 mg | INTRAMUSCULAR | Status: DC | PRN

## 2021-03-26 MED ORDER — LIDOCAINE HCL (PF) 1 % IJ SOLN
INTRAMUSCULAR | Status: DC | PRN
Start: 2021-03-26 — End: 2021-03-27
  Administered 2021-03-26 (×2): 4 mL via EPIDURAL

## 2021-03-26 MED ORDER — PHENYLEPHRINE 40 MCG/ML (10ML) SYRINGE FOR IV PUSH (FOR BLOOD PRESSURE SUPPORT): 80.0000 ug | PREFILLED_SYRINGE | INTRAVENOUS | Status: DC | PRN

## 2021-03-26 MED ORDER — LABETALOL HCL 5 MG/ML IV SOLN
20.0000 mg | INTRAVENOUS | Status: DC | PRN
  Administered 2021-03-27 (×2): 20 mg via INTRAVENOUS
  Filled 2021-03-26 (×3): qty 4

## 2021-03-26 MED ORDER — LABETALOL HCL 200 MG PO TABS
200.0000 mg | ORAL_TABLET | Freq: Two times a day (BID) | ORAL | Status: DC
  Administered 2021-03-26 – 2021-03-27 (×2): 200 mg via ORAL
  Filled 2021-03-26 (×2): qty 1

## 2021-03-26 MED ORDER — EPHEDRINE 5 MG/ML INJ
10.0000 mg | INTRAVENOUS | Status: DC | PRN
  Administered 2021-03-26: 10 mg via INTRAVENOUS

## 2021-03-26 MED ORDER — EPHEDRINE 5 MG/ML INJ
10.0000 mg | INTRAVENOUS | Status: DC | PRN
  Filled 2021-03-26: qty 10

## 2021-03-26 MED ORDER — DIPHENHYDRAMINE HCL 50 MG/ML IJ SOLN: 12.5000 mg | INTRAMUSCULAR | Status: DC | PRN

## 2021-03-26 MED ORDER — FENTANYL-BUPIVACAINE-NACL 0.5-0.125-0.9 MG/250ML-% EP SOLN
12.0000 mL/h | EPIDURAL | Status: DC | PRN
Start: 2021-03-26 — End: 2021-03-27
  Administered 2021-03-26 – 2021-03-27 (×2): 12 mL/h via EPIDURAL
  Filled 2021-03-26 (×2): qty 250

## 2021-03-26 MED ORDER — LABETALOL HCL 5 MG/ML IV SOLN
80.0000 mg | INTRAVENOUS | Status: DC | PRN
  Administered 2021-03-27: 80 mg via INTRAVENOUS

## 2021-03-26 NOTE — Progress Notes (Signed)
Labor Progress Note Jacqueline Yu is a 20 y.o. G2P0010 at [redacted]w[redacted]d presented for IOL-preE w/SF. S: Doing well without complaints.  O:  BP (!) 143/102   Pulse 86   Temp 98.3 F (36.8 C) (Oral)   Resp 16   Ht 5\' 4"  (1.626 m)   Wt 106.8 kg   LMP 07/15/2020   SpO2 96%   BMI 40.41 kg/m  EFM: baseline 130bpm/mod variability/+accels/no decels Toco: quiet  CVE: Dilation: 4 Effacement (%): 60 Cervical Position: Posterior Station: -3 Presentation: Vertex Exam by:: Dr. [email or web address]   [name]: 20 y.o. G2P0010 [redacted]w[redacted]d presented for IOL-preE w/ SF. #IOL: IOL started 5/13 with cyto x2. FB dislodged @0015  on 5/14. Pitocin started @0335  5/14 and patient made minimal cervical change so pitocin was stopped at 0900 and cyto re-dosed x4. Will start pitocin now, consider AROM after some fetal descent. #Pain: PRN, desires epidural #FWB: cat 1 #GBS unknown, culture pending, PCN given preterm #preE w/ SF: asymptomatic. BP severe range, has been receiving labetalol per anti-htn protocol. On Mg. preE labs demonstrated p/c 6.24, otherwise LFTs and platelents wnl. Repeat preE labs unremarkable. Mg 5.0, therapeutic.  #A1GDM: not checking BGL prior. q4 in latent labor and q2 in active labor. EFW 2171g, 3.6%ile. #marginal cord insert: placenta to pathology  , MD 5:00 AM

## 2021-03-26 NOTE — Progress Notes (Signed)
Jacqueline Yu is a 20 y.o. G2P0010 at [redacted]w[redacted]d.  Subjective: Coping well w/ UC's.   Objective: BP (!) 158/96   Pulse 85   Temp 98.1 F (36.7 C) (Oral)   Resp 16   Ht 5\' 4"  (1.626 m)   Wt 106.8 kg   LMP 07/15/2020   SpO2 96%   BMI 40.41 kg/m    FHT:  FHR: 130 bpm, variability: mod,  accelerations:  15x15,  decelerations:  Rare late decles UC:   Q 1-5 minutes, mild-mod (Difficult to trace despite adjusting toco) Dilation: 4 Effacement (%): 60 Cervical Position: Posterior Station: -3 Presentation: Vertex Exam by:: Jacqueline Yu cnm AROM small amount of clear fluid. IUPC placed w/out difficulty.   Labs: Results for orders placed or performed during the hospital encounter of 03/24/21 (from the past 24 hour(s))  Glucose, capillary     Status: Abnormal   Collection Time: 03/25/21  1:06 PM  Result Value Ref Range   Glucose-Capillary 124 (H) 70 - 99 mg/dL  Glucose, capillary     Status: Abnormal   Collection Time: 03/25/21  5:02 PM  Result Value Ref Range   Glucose-Capillary 125 (H) 70 - 99 mg/dL  Glucose, capillary     Status: Abnormal   Collection Time: 03/25/21  7:23 PM  Result Value Ref Range   Glucose-Capillary 120 (H) 70 - 99 mg/dL  Glucose, capillary     Status: None   Collection Time: 03/26/21 12:06 AM  Result Value Ref Range   Glucose-Capillary 90 70 - 99 mg/dL  CBC     Status: Abnormal   Collection Time: 03/26/21 12:24 AM  Result Value Ref Range   WBC 6.8 4.0 - 10.5 K/uL   RBC 4.12 3.87 - 5.11 MIL/uL   Hemoglobin 11.6 (L) 12.0 - 15.0 g/dL   HCT 03/28/21 (L) 16.1 - 09.6 %   MCV 85.7 80.0 - 100.0 fL   MCH 28.2 26.0 - 34.0 pg   MCHC 32.9 30.0 - 36.0 g/dL   RDW 04.5 40.9 - 81.1 %   Platelets 314 150 - 400 K/uL   nRBC 0.0 0.0 - 0.2 %  Comprehensive metabolic panel     Status: Abnormal   Collection Time: 03/26/21 12:24 AM  Result Value Ref Range   Sodium 132 (L) 135 - 145 mmol/L   Potassium 4.2 3.5 - 5.1 mmol/L   Chloride 102 98 - 111 mmol/L   CO2 25 22 - 32 mmol/L    Glucose, Bld 86 70 - 99 mg/dL   BUN 6 6 - 20 mg/dL   Creatinine, Ser 03/28/21 0.44 - 1.00 mg/dL   Calcium 7.8 (L) 8.9 - 10.3 mg/dL   Total Protein 6.2 (L) 6.5 - 8.1 g/dL   Albumin 2.6 (L) 3.5 - 5.0 g/dL   AST 21 15 - 41 U/L   ALT 19 0 - 44 U/L   Alkaline Phosphatase 153 (H) 38 - 126 U/L   Total Bilirubin 0.4 0.3 - 1.2 mg/dL   GFR, Estimated 7.82 >95 mL/min   Anion gap 5 5 - 15  Magnesium     Status: Abnormal   Collection Time: 03/26/21 12:24 AM  Result Value Ref Range   Magnesium 5.0 (H) 1.7 - 2.4 mg/dL  Glucose, capillary     Status: None   Collection Time: 03/26/21  6:46 AM  Result Value Ref Range   Glucose-Capillary 82 70 - 99 mg/dL  Glucose, capillary     Status: None   Collection Time: 03/26/21 10:08 AM  Result Value Ref Range   Glucose-Capillary 77 70 - 99 mg/dL    Assessment / Plan: [redacted]w[redacted]d week IUP Labor: IOL, slow progress. Now AROM'd and IUPC in place. Will titrate pitocin to achieve adequate MVU's. Fetal Wellbeing:  Category I Pain Control:  Comfort measures.  Anticipated MOD:  SVD Pre-E w/ severe features:  Three severe range BP's. Labetalol 20 IV given. BP out of severe range. Will repeat labs since at has been 12 hours and for platelets in case pt want epidural.  A1GDM:  CBGs Nml now that pt is being mindful of carb intake.   Jacqueline Yu, [place], CNM 03/26/2021 11:58 AM

## 2021-03-26 NOTE — Progress Notes (Signed)
Labor Progress Note Jacqueline Yu is a 20 y.o. G2P0010 at [redacted]w[redacted]d presented for IOL-preE w/SF. S: Doing well without complaints.  O:  BP (!) 147/85   Pulse 75   Temp 98.5 F (36.9 C) (Oral)   Resp 16   Ht 5\' 4"  (1.626 m)   Wt 106.8 kg   LMP 07/15/2020   SpO2 100%   BMI 40.41 kg/m  EFM: baseline 120bpm/mod variability/+accels/occasional late or variable decel with contractions Toco: quiet  CVE: Dilation: 5 Effacement (%): 70 Cervical Position: Posterior Station: -2 Presentation: Vertex Exam by:: Dr. [email or web address]   [name]: 20 y.o. G2P0010 [redacted]w[redacted]d presented for IOL-preE w/ SF. #IOL: IOL started 5/13 with cyto x2. FB dislodged @0015  on 5/14. Pitocin started @0335  5/14 and patient made minimal cervical change so pitocin was stopped at 0900 and cyto re-dosed x4. Pitocin restarted today @0415 . AROM/IUPC placed @1030 . FSE placed ~1500 with some decels and pitocin was paused x1 hour at that time and then restarted. Patient has made minimal cervical change so after discussion with Dr. 6/14 will stop pitocin and re-dose cytotec x1 and then restart pitocin in 4 hours. Patient amendable with plan. #Pain: PRN, desires epidural #FWB: overall cat 1 #GBS negative culture 5/13 on admit, PCN stopped #preE w/ SF: asymptomatic. BP severe range, has been receiving labetalol per anti-htn protocol. Labetalol PO 200 mg BID started today. On Mg. preE labs demonstrated p/c 6.24, otherwise LFTs and platelents wnl. Repeat preE labs unremarkable. Mg 5.0, therapeutic.  #A1GDM: not checking BGL prior. q4 in latent labor and q2 in active labor. EFW 2171g, 3.6%ile. #marginal cord insert: placenta to pathology  , MD 8:43 PM

## 2021-03-26 NOTE — Anesthesia Preprocedure Evaluation (Addendum)
Anesthesia Evaluation  Patient identified by MRN, date of birth, ID band Patient awake    Reviewed: Allergy & Precautions, NPO status , Patient's Chart, lab work & pertinent test results, reviewed documented beta blocker date and time   Airway Mallampati: II  TM Distance: >3 FB Neck ROM: Full    Dental no notable dental hx. (+) Teeth Intact   Pulmonary neg pulmonary ROS,    Pulmonary exam normal breath sounds clear to auscultation       Cardiovascular hypertension, Pt. on medications and Pt. on home beta blockers  Rhythm:Regular Rate:Normal     Neuro/Psych negative neurological ROS  negative psych ROS   GI/Hepatic Neg liver ROS, GERD  ,  Endo/Other  diabetes, Well Controlled, GestationalMorbid obesity  Renal/GU negative Renal ROS  negative genitourinary   Musculoskeletal   Abdominal (+) + obese,   Peds  Hematology  (+) anemia ,   Anesthesia Other Findings   Reproductive/Obstetrics (+) Pregnancy 36 2/7 weeks Induction for Pre eclampsia with SF On MgSO4                             Anesthesia Physical Anesthesia Plan  ASA: III  Anesthesia Plan: Epidural   Post-op Pain Management:    Induction:   PONV Risk Score and Plan:   Airway Management Planned: Natural Airway  Additional Equipment:   Intra-op Plan:   Post-operative Plan:   Informed Consent: I have reviewed the patients History and Physical, chart, labs and discussed the procedure including the risks, benefits and alternatives for the proposed anesthesia with the patient or authorized representative who has indicated his/her understanding and acceptance.       Plan Discussed with: Anesthesiologist  Anesthesia Plan Comments:        Anesthesia Quick Evaluation

## 2021-03-26 NOTE — Progress Notes (Signed)
Londa Mackowski is a 20 y.o. G2P0010 at [redacted]w[redacted]d.  Subjective: Comfortable w/ epidural.   Objective: BP 136/63   Pulse 97   Temp 98.5 F (36.9 C) (Oral)   Resp 16   Ht 5\' 4"  (1.626 m)   Wt 106.8 kg   LMP 07/15/2020   SpO2 100%   BMI 40.41 kg/m    Patient Vitals for the past 24 hrs:  BP Temp Temp src Pulse Resp SpO2  03/26/21 1521 (!) 153/103 -- -- 80 17 --  03/26/21 1520 -- -- -- -- -- 99 %  03/26/21 1516 (!) 155/90 -- -- 77 16 --  03/26/21 1515 -- -- -- -- -- 99 %  03/26/21 1514 (!) 142/90 -- -- 74 16 --  03/26/21 1511 121/67 -- -- 73 16 --  03/26/21 1510 -- -- -- -- -- 98 %  03/26/21 1506 113/83 -- -- (!) 138 17 --  03/26/21 1505 -- -- -- -- -- 98 %  03/26/21 1503 131/73 -- -- 76 16 --  03/26/21 1501 (!) 166/118 -- -- 75 17 --  03/26/21 1500 -- -- -- -- -- 99 %  03/26/21 1458 (!) 150/99 -- -- 78 -- --  03/26/21 1457 (!) 150/99 -- -- 78 17 99 %  03/26/21 1431 (!) 158/100 -- -- 85 16 --  03/26/21 1401 (!) 161/109 -- -- 90 17 --  03/26/21 1338 (!) 144/93 98.7 F (37.1 C) Oral 78 16 --  03/26/21 1300 -- -- -- -- 16 --  03/26/21 1231 (!) 152/90 -- -- 82 17 --  03/26/21 1217 (!) 158/92 -- -- 78 -- --  03/26/21 1201 (!) 160/94 -- -- 84 16 --  03/26/21 1137 (!) 158/96 -- -- 85 -- --  03/26/21 1121 -- -- -- 81 -- --  03/26/21 1116 (!) 170/106 98.1 F (36.7 C) Oral 87 16 --  03/26/21 1104 (!) 166/94 -- -- 86 -- --  03/26/21 1101 (!) 169/108 -- -- 82 17 --  03/26/21 1031 (!) 159/103 -- -- 86 17 --  03/26/21 1008 139/84 -- -- 74 16 --     FHT:  FHR: 120 bpm, variability: min-mod,  accelerations:  15x15,  decelerations:  Mild variables and prolonged decl x 13 minutes down to 60-110's.  UC:   Q 1-6 minutes, MVU's 210-280 Dilation: 4 Effacement (%): 70 Cervical Position: Posterior Station: -2 Presentation: Vertex Exam by:: Aradhana Gin CNM  Labs: CBC     Status: Abnormal   Collection Time: 03/26/21 11:25 AM  Result Value Ref Range   WBC 5.4 4.0 - 10.5 K/uL   RBC 4.00 3.87 -  5.11 MIL/uL   Hemoglobin 11.4 (L) 12.0 - 15.0 g/dL   HCT 03/28/21 (L) 63.8 - 93.7 %   MCV 86.3 80.0 - 100.0 fL   MCH 28.5 26.0 - 34.0 pg   MCHC 33.0 30.0 - 36.0 g/dL   RDW 34.2 87.6 - 81.1 %   Platelets 286 150 - 400 K/uL   nRBC 0.0 0.0 - 0.2 %  Glucose, capillary     Status: None   Collection Time: 03/26/21  1:44 PM  Result Value Ref Range   Glucose-Capillary 97 70 - 99 mg/dL    Assessment / Plan: [redacted]w[redacted]d week IUP Labor: Early Fetal Wellbeing:  Category II w/ prolonged decel likely due to drop in BP after epidural (113/83). Recovered w/ position changes, pit D/C'd, Terb, Phenylephrine. Dr. [redacted]w[redacted]d at [facility].  Pain Control:  Epidural Anticipated MOD:  Uncertain. Will CTO FHR  closely. Discussed w/ pt and family that if prolonged decels recur she would need C/S. Pt verbalized understanding.  Pre-E w/ SF: No SR BP's. On PO Labetalol.   Katrinka Blazing, [place], [place] 03/26/2021 3:30 PM

## 2021-03-26 NOTE — Anesthesia Procedure Notes (Signed)
Epidural Patient location during procedure: OB Start time: 03/26/2021 2:54 PM End time: 03/26/2021 3:02 PM  Staffing Anesthesiologist: Mal Amabile, MD Performed: anesthesiologist   Preanesthetic Checklist Completed: patient identified, IV checked, site marked, risks and benefits discussed, surgical consent, monitors and equipment checked, pre-op evaluation and timeout performed  Epidural Patient position: sitting Prep: DuraPrep and site prepped and draped Patient monitoring: continuous pulse ox and blood pressure Approach: midline Location: L3-L4 Injection technique: LOR air  Needle:  Needle type: Tuohy  Needle gauge: 17 G Needle length: 9 cm and 9 Needle insertion depth: 6 cm Catheter type: closed end flexible Catheter size: 19 Gauge Catheter at skin depth: 11 cm Test dose: negative and Other  Assessment Events: blood not aspirated, injection not painful, no injection resistance, no paresthesia and negative IV test  Additional Notes Patient identified. Risks and benefits discussed including failed block, incomplete  Pain control, post dural puncture headache, nerve damage, paralysis, blood pressure Changes, nausea, vomiting, reactions to medications-both toxic and allergic and post Partum back pain. All questions were answered. Patient expressed understanding and wished to proceed. Sterile technique was used throughout procedure. Epidural site was Dressed with sterile barrier dressing. No paresthesias, signs of intravascular injection Or signs of intrathecal spread were encountered.  Patient was more comfortable after the epidural was dosed. Please see RN's note for documentation of vital signs and FHR which are stable. Reason for block:procedure for pain

## 2021-03-26 NOTE — Progress Notes (Signed)
Jacqueline Yu is a 20 y.o. G2P0010 at [redacted]w[redacted]d.  Subjective: Mid pelvic pressure w/ UC's.   Objective: BP 136/63   Pulse 97   Temp 98.5 F (36.9 C) (Oral)   Resp 16   Ht 5\' 4"  (1.626 m)   Wt 106.8 kg   LMP 07/15/2020   SpO2 100%   BMI 40.41 kg/m    FHT:  FHR: 125 bpm, variability: min-mod,  accelerations:  15x15,  decelerations:  Mild variables UC:   Q 1-5 minutes, MVU's 250-280 Dilation: 5 Effacement (%): 70 Cervical Position: Posterior Station: -2 Presentation: Vertex Exam by:: [email or web address]  Labs: Results for orders placed or performed during the hospital encounter of 03/24/21 (from the past 24 hour(s))  Glucose, capillary     Status: Abnormal   Collection Time: 03/25/21  7:23 PM  Result Value Ref Range   Glucose-Capillary 120 (H) 70 - 99 mg/dL  Glucose, capillary     Status: None   Collection Time: 03/26/21 12:06 AM  Result Value Ref Range   Glucose-Capillary 90 70 - 99 mg/dL  CBC     Status: Abnormal   Collection Time: 03/26/21 12:24 AM  Result Value Ref Range   WBC 6.8 4.0 - 10.5 K/uL   RBC 4.12 3.87 - 5.11 MIL/uL   Hemoglobin 11.6 (L) 12.0 - 15.0 g/dL   HCT 03/28/21 (L) 53.9 - 76.7 %   MCV 85.7 80.0 - 100.0 fL   MCH 28.2 26.0 - 34.0 pg   MCHC 32.9 30.0 - 36.0 g/dL   RDW 34.1 93.7 - 90.2 %   Platelets 314 150 - 400 K/uL   nRBC 0.0 0.0 - 0.2 %  Comprehensive metabolic panel     Status: Abnormal   Collection Time: 03/26/21 12:24 AM  Result Value Ref Range   Sodium 132 (L) 135 - 145 mmol/L   Potassium 4.2 3.5 - 5.1 mmol/L   Chloride 102 98 - 111 mmol/L   CO2 25 22 - 32 mmol/L   Glucose, Bld 86 70 - 99 mg/dL   BUN 6 6 - 20 mg/dL   Creatinine, Ser 03/28/21 0.44 - 1.00 mg/dL   Calcium 7.8 (L) 8.9 - 10.3 mg/dL   Total Protein 6.2 (L) 6.5 - 8.1 g/dL   Albumin 2.6 (L) 3.5 - 5.0 g/dL   AST 21 15 - 41 U/L   ALT 19 0 - 44 U/L   Alkaline Phosphatase 153 (H) 38 - 126 U/L   Total Bilirubin 0.4 0.3 - 1.2 mg/dL   GFR, Estimated 7.35 >32 mL/min   Anion gap 5 5 - 15  Magnesium      Status: Abnormal   Collection Time: 03/26/21 12:24 AM  Result Value Ref Range   Magnesium 5.0 (H) 1.7 - 2.4 mg/dL  Glucose, capillary     Status: None   Collection Time: 03/26/21  6:46 AM  Result Value Ref Range   Glucose-Capillary 82 70 - 99 mg/dL  Glucose, capillary     Status: None   Collection Time: 03/26/21 10:08 AM  Result Value Ref Range   Glucose-Capillary 77 70 - 99 mg/dL  CBC     Status: Abnormal   Collection Time: 03/26/21 11:25 AM  Result Value Ref Range   WBC 5.4 4.0 - 10.5 K/uL   RBC 4.00 3.87 - 5.11 MIL/uL   Hemoglobin 11.4 (L) 12.0 - 15.0 g/dL   HCT 03/28/21 (L) 24.2 - 68.3 %   MCV 86.3 80.0 - 100.0 fL   MCH 28.5  26.0 - 34.0 pg   MCHC 33.0 30.0 - 36.0 g/dL   RDW 33.8 25.0 - 53.9 %   Platelets 286 150 - 400 K/uL   nRBC 0.0 0.0 - 0.2 %  Glucose, capillary     Status: None   Collection Time: 03/26/21  1:44 PM  Result Value Ref Range   Glucose-Capillary 97 70 - 99 mg/dL  Glucose, capillary     Status: None   Collection Time: 03/26/21  6:02 PM  Result Value Ref Range   Glucose-Capillary 83 70 - 99 mg/dL    Assessment / Plan: [redacted]w[redacted]d week IUP Labor: Protracted early labor despite adequate MVU's. Cervix more anterior but still firm. Good descent. Suspect malposition. Encouraged position changes, peanut.  Fetal Wellbeing:  Category II, but overall reassuring w/ mod variability, accels, + scalp stim Pain Control:  Epidural Anticipated MOD:  SVD  Katrinka Blazing, [place], CNM 03/26/2021 7:22 PM

## 2021-03-27 ENCOUNTER — Encounter (HOSPITAL_COMMUNITY): Payer: Self-pay | Admitting: Obstetrics and Gynecology

## 2021-03-27 ENCOUNTER — Encounter: Payer: Medicaid Other | Admitting: Obstetrics & Gynecology

## 2021-03-27 DIAGNOSIS — O24420 Gestational diabetes mellitus in childbirth, diet controlled: Secondary | ICD-10-CM

## 2021-03-27 DIAGNOSIS — Z3A36 36 weeks gestation of pregnancy: Secondary | ICD-10-CM

## 2021-03-27 DIAGNOSIS — O1414 Severe pre-eclampsia complicating childbirth: Secondary | ICD-10-CM

## 2021-03-27 DIAGNOSIS — O365930 Maternal care for other known or suspected poor fetal growth, third trimester, not applicable or unspecified: Secondary | ICD-10-CM

## 2021-03-27 LAB — CBC
HCT: 35 % — ABNORMAL LOW (ref 36.0–46.0)
HCT: 37.5 % (ref 36.0–46.0)
Hemoglobin: 11.6 g/dL — ABNORMAL LOW (ref 12.0–15.0)
Hemoglobin: 12.1 g/dL (ref 12.0–15.0)
MCH: 28.1 pg (ref 26.0–34.0)
MCH: 28.6 pg (ref 26.0–34.0)
MCHC: 32.3 g/dL (ref 30.0–36.0)
MCHC: 33.1 g/dL (ref 30.0–36.0)
MCV: 86.4 fL (ref 80.0–100.0)
MCV: 87.2 fL (ref 80.0–100.0)
Platelets: 320 10*3/uL (ref 150–400)
Platelets: 348 K/uL (ref 150–400)
RBC: 4.05 MIL/uL (ref 3.87–5.11)
RBC: 4.3 MIL/uL (ref 3.87–5.11)
RDW: 14.5 % (ref 11.5–15.5)
RDW: 14.6 % (ref 11.5–15.5)
WBC: 8 K/uL (ref 4.0–10.5)
WBC: 8.1 10*3/uL (ref 4.0–10.5)
nRBC: 0 % (ref 0.0–0.2)
nRBC: 0 % (ref 0.0–0.2)

## 2021-03-27 LAB — COMPREHENSIVE METABOLIC PANEL
ALT: 22 U/L (ref 0–44)
AST: 24 U/L (ref 15–41)
Albumin: 2.4 g/dL — ABNORMAL LOW (ref 3.5–5.0)
Alkaline Phosphatase: 153 U/L — ABNORMAL HIGH (ref 38–126)
Anion gap: 8 (ref 5–15)
BUN: <5 mg/dL — ABNORMAL LOW (ref 6–20)
CO2: 24 mmol/L (ref 22–32)
Calcium: 8.1 mg/dL — ABNORMAL LOW (ref 8.9–10.3)
Chloride: 100 mmol/L (ref 98–111)
Creatinine, Ser: 0.59 mg/dL (ref 0.44–1.00)
GFR, Estimated: >60 mL/min (ref >60)
Glucose, Bld: 83 mg/dL (ref 70–99)
Potassium: 4.3 mmol/L (ref 3.5–5.1)
Sodium: 132 mmol/L — ABNORMAL LOW (ref 135–145)
Total Bilirubin: 0.4 mg/dL (ref 0.3–1.2)
Total Protein: 6.2 g/dL — ABNORMAL LOW (ref 6.5–8.1)

## 2021-03-27 LAB — GLUCOSE, CAPILLARY
Glucose-Capillary: 100 mg/dL — ABNORMAL HIGH (ref 70–99)
Glucose-Capillary: 108 mg/dL — ABNORMAL HIGH (ref 70–99)

## 2021-03-27 LAB — MAGNESIUM: Magnesium: 5 mg/dL — ABNORMAL HIGH (ref 1.7–2.4)

## 2021-03-27 MED ORDER — DIBUCAINE (PERIANAL) 1 % EX OINT: 1.0000 "application " | TOPICAL_OINTMENT | CUTANEOUS | Status: DC | PRN

## 2021-03-27 MED ORDER — IBUPROFEN 600 MG PO TABS
600.0000 mg | ORAL_TABLET | Freq: Four times a day (QID) | ORAL | Status: DC
  Administered 2021-03-27 – 2021-03-30 (×10): 600 mg via ORAL
  Filled 2021-03-27 (×11): qty 1

## 2021-03-27 MED ORDER — SENNOSIDES-DOCUSATE SODIUM 8.6-50 MG PO TABS
2.0000 | ORAL_TABLET | Freq: Every day | ORAL | Status: DC
  Administered 2021-03-28 – 2021-03-30 (×2): 2 via ORAL
  Filled 2021-03-27 (×3): qty 2

## 2021-03-27 MED ORDER — DIPHENHYDRAMINE HCL 25 MG PO CAPS: 25.0000 mg | ORAL_CAPSULE | Freq: Four times a day (QID) | ORAL | Status: DC | PRN

## 2021-03-27 MED ORDER — AMLODIPINE BESYLATE 5 MG PO TABS
5.0000 mg | ORAL_TABLET | Freq: Every day | ORAL | Status: DC
  Administered 2021-03-27: 5 mg via ORAL
  Filled 2021-03-27: qty 1

## 2021-03-27 MED ORDER — WITCH HAZEL-GLYCERIN EX PADS: 1.0000 "application " | MEDICATED_PAD | CUTANEOUS | Status: DC | PRN

## 2021-03-27 MED ORDER — AMLODIPINE BESYLATE 5 MG PO TABS: 5.0000 mg | ORAL_TABLET | Freq: Once | ORAL | Status: DC

## 2021-03-27 MED ORDER — AMLODIPINE BESYLATE 5 MG PO TABS
5.0000 mg | ORAL_TABLET | Freq: Once | ORAL | Status: AC
  Administered 2021-03-27: 5 mg via ORAL
  Filled 2021-03-27: qty 1

## 2021-03-27 MED ORDER — ACETAMINOPHEN 325 MG PO TABS
650.0000 mg | ORAL_TABLET | Freq: Four times a day (QID) | ORAL | Status: DC
  Administered 2021-03-27 – 2021-03-30 (×10): 650 mg via ORAL
  Filled 2021-03-27 (×10): qty 2

## 2021-03-27 MED ORDER — ONDANSETRON HCL 4 MG PO TABS: 4.0000 mg | ORAL_TABLET | ORAL | Status: DC | PRN

## 2021-03-27 MED ORDER — ONDANSETRON HCL 4 MG/2ML IJ SOLN: 4.0000 mg | INTRAMUSCULAR | Status: DC | PRN

## 2021-03-27 MED ORDER — LACTATED RINGERS AMNIOINFUSION
INTRAVENOUS | Status: DC
  Filled 2021-03-27 (×2): qty 1000

## 2021-03-27 MED ORDER — LACTATED RINGERS IV SOLN: INTRAVENOUS | Status: DC

## 2021-03-27 MED ORDER — SIMETHICONE 80 MG PO CHEW: 80.0000 mg | CHEWABLE_TABLET | ORAL | Status: DC | PRN

## 2021-03-27 MED ORDER — TETANUS-DIPHTH-ACELL PERTUSSIS 5-2.5-18.5 LF-MCG/0.5 IM SUSY
0.5000 mL | PREFILLED_SYRINGE | Freq: Once | INTRAMUSCULAR | Status: AC
  Administered 2021-03-30: 0.5 mL via INTRAMUSCULAR
  Filled 2021-03-27: qty 0.5

## 2021-03-27 MED ORDER — BENZOCAINE-MENTHOL 20-0.5 % EX AERO
1.0000 | INHALATION_SPRAY | CUTANEOUS | Status: DC | PRN
Start: 2021-03-27 — End: 2021-03-30

## 2021-03-27 MED ORDER — COCONUT OIL OIL: 1.0000 "application " | TOPICAL_OIL | Status: DC | PRN

## 2021-03-27 MED ORDER — AMLODIPINE BESYLATE 10 MG PO TABS: 10.0000 mg | ORAL_TABLET | Freq: Once | ORAL | Status: DC

## 2021-03-27 MED ORDER — MAGNESIUM SULFATE 40 GM/1000ML IV SOLN
2.0000 g/h | INTRAVENOUS | Status: AC
  Administered 2021-03-27 (×2): 2 g/h via INTRAVENOUS
  Filled 2021-03-27: qty 1000

## 2021-03-27 MED ORDER — PRENATAL MULTIVITAMIN CH
1.0000 | ORAL_TABLET | Freq: Every day | ORAL | Status: DC
  Administered 2021-03-27 – 2021-03-29 (×2): 1 via ORAL
  Filled 2021-03-27 (×2): qty 1

## 2021-03-27 MED ORDER — AMLODIPINE BESYLATE 10 MG PO TABS
10.0000 mg | ORAL_TABLET | Freq: Every day | ORAL | Status: DC
  Administered 2021-03-28 – 2021-03-30 (×3): 10 mg via ORAL
  Filled 2021-03-27 (×3): qty 1

## 2021-03-27 NOTE — Discharge Instructions (Signed)

## 2021-03-27 NOTE — Lactation Note (Signed)
This note was copied from a baby's chart. Lactation Consultation Note  Patient Name: Jacqueline Yu [id number] Date: 03/27/2021 Reason for consult: Follow-up assessment;Mother's request;Primapara;1st time breastfeeding;Late-preterm 34-36.6wks;Maternal endocrine disorder;Infant < 6lbs Age:22 hours Infant in NICU. LC checked flange size assessed Mom on 24 flanges.  Mom will be pumping q 3 hrs for .  LC reviewed NICU brochure.  LC reviewed inpatient and outpatient services.   All questions answered at the end of the visit.  Maternal Data Has patient been taught Hand Expression?: Yes  Feeding Mother's Current Feeding Choice: Breast Milk and Donor Milk Nipple Type: Nfant Extra Slow Flow (gold)  LATCH Score                    Lactation Tools Discussed/Used Tools: Pump;Flanges Flange Size: 24 Breast pump type: Double-Electric Breast Pump Pump Education: Setup, frequency, and cleaning;Milk Storage Reason for Pumping: increase stimulation Pumping frequency: every 3 hrs for 15 minutes  Interventions Interventions: Breast feeding basics reviewed;Education;Expressed milk;Hand express;DEBP  Discharge Pump: Manual WIC Program: Yes  Consult Status Consult Status: Follow-up Date: 03/28/21 Follow-up type: In-patient    Jacqueline Yu  Nicholson-Springer 03/27/2021, 5:24 PM

## 2021-03-27 NOTE — Lactation Note (Signed)
This note was copied from a baby's chart. Lactation Consultation Note  Patient Name: Boy Ameira Alessandrini [id number] Date: 03/27/2021 Reason for consult: L&D Initial assessment;Late-preterm 34-36.6wks Age:22 hours   Initial L&D Note:  Spoke with RN prior to visiting and she informed me that mother was not ready to receive a visit from [facility].  Her baby has been transferred to the NICU.  Notified 1st floor/NICU LC about this new admission.   Maternal Data    Feeding    LATCH Score                    Lactation Tools Discussed/Used    Interventions    Discharge    Consult Status Consult Status: Follow-up Date: 03/27/21 Follow-up type: In-patient    Roger Kettles R Temia Debroux 03/27/2021, 11:02 AM

## 2021-03-27 NOTE — Lactation Note (Signed)
This note was copied from a baby's chart. Lactation Consultation Note LC to [facility] for initial consult with mother of 3-hour old late preterm infant. Mother on Mag and very sleepy. Pump was set up in room but she has not initiated pumping at this time. She is going to nap and then pump. She is aware that best practice is to initiate pumping within 6 hours of delivery and to pump q3. LC offered to return to assist further when she is awake and alert. LC provided NICU book and LC brochure. LC reviewed option of receiving WIC pump and encouraged family to determine eligibility. Family is aware of LC services.  Patient Name: Boy Johnie Stadel [id number] Date: 03/27/2021 Reason for consult: NICU baby;Initial assessment;Maternal endocrine disorder;Late-preterm 34-36.6wks Age:22 hours  Maternal Data  No hx breast surgery/trauma Mom not on WIC during pregnancy but thinks she is WIC-eligible She does not have a pump for use at home  Feeding Mother's Current Feeding Choice: Breast Milk and Donor Milk Nipple Type: Nfant Slow Flow (purple) (change to gold)   Interventions Interventions: Education;Breast feeding basics reviewed   Consult Status Consult Status: Follow-up Date: 03/27/21 Follow-up type: In-patient   Elder Negus, MA IBCLC 03/27/2021, 1:57 PM

## 2021-03-27 NOTE — Discharge Summary (Signed)
Postpartum Discharge Summary  Date of Service updated 03/30/2021    Patient Name: Jacqueline Yu DOB: 09-16-2001 MRN: [id number]  Date of admission: 03/24/2021 Delivery date:03/27/2021  Delivering provider: Sheila Oats  Date of discharge: 03/30/2021  Admitting diagnosis: Severe preeclampsia, third trimester [O14.13] Encounter for induction of labor [Z34.90] Intrauterine pregnancy: [redacted]w[redacted]d     Secondary diagnosis:  Principal Problem:   Vaginal delivery Active Problems:   History of trauma    ASCUS with positive high risk HPV cervical   Diabetes mellitus affecting pregnancy   Fetal cardiac echogenic focus   Marginal insertion of umbilical cord affecting management of mother   Severe preeclampsia, third trimester   Encounter for induction of labor   Personal history of gestational diabetes  Additional problems: as noted above Discharge diagnosis: Preterm Pregnancy Delivered                                              Post partum procedures:no Augmentation: AROM, Pitocin, Cytotec and IP Foley Complications: None  Hospital course: Induction of Labor With Vaginal Delivery   20 y.o. yo G2P0111 at [redacted]w[redacted]d was admitted to the hospital 03/24/2021 for induction of labor.  Indication for induction: severe preeclampsia. She was started on magnesium shortly after admission. Pt initially received cytotec and FB. She was ultimately transitioned to pitocin and had AROM for clear fluid. She progressed to complete cervical dilation with up-titration of pitocin. Patient had an uncomplicated labor course as follows: Membrane Rupture Time/Date: 10:28 AM ,03/26/2021   Delivery Method:Vaginal, Spontaneous  Episiotomy: None  Lacerations:  None  Details of delivery can be found in separate delivery note. Pt was continued on postpartum magnesium for 24 hours s/p delivery. Patient had a routine postpartum course. Patient is discharged home 03/30/21. Given persistent elevated blood pressures s/p delivery pt  was discharged home on norvasc 10mg  daily and labetalol 200 mg BID with plan for 1 week blood pressure check in clinic.  Newborn Data: Birth date:03/27/2021  Birth time:10:21 AM  Gender:Female  Living status:Living  Apgars:8 ,9  Weight:1880 g   Magnesium Sulfate received: Yes: Seizure prophylaxis BMZ received: No Rhophylac:N/A MMR:N/A T-DaP:Given prenatallyoffered prior to discharge Flu: Nooffered prior to discharge Transfusion:No  Physical exam  Vitals:   03/29/21 1700 03/29/21 2156 03/30/21 0138 03/30/21 0453  BP:  139/90 136/84 127/68  Pulse: 89 86 96 94  Resp:  16 18   Temp: 98.2 F (36.8 C) 98.9 F (37.2 C) 98 F (36.7 C)   TempSrc: Oral Oral Oral   SpO2: 100% 100% 96%   Weight:      Height:       General: alert, cooperative and no distress Lochia: appropriate Uterine Fundus: firm Incision: N/A DVT Evaluation: No evidence of DVT seen on physical exam. Labs: Lab Results  Component Value Date   WBC 9.8 03/28/2021   HGB 9.8 (L) 03/28/2021   HCT 29.9 (L) 03/28/2021   MCV 87.9 03/28/2021   PLT 307 03/28/2021   CMP Latest Ref Rng & Units 03/26/2021  Glucose 70 - 99 mg/dL 83  BUN 6 - 20 mg/dL 03/28/2021)  Creatinine <2(A - 1.00 mg/dL 8.36  Sodium 1.84 - 142 mmol/L 132(L)  Potassium 3.5 - 5.1 mmol/L 4.3  Chloride 98 - 111 mmol/L 100  CO2 22 - 32 mmol/L 24  Calcium 8.9 - 10.3 mg/dL 8.1(L)  Total  Protein 6.5 - 8.1 g/dL 6.2(L)  Total Bilirubin 0.3 - 1.2 mg/dL 0.4  Alkaline Phos 38 - 126 U/L 153(H)  AST 15 - 41 U/L 24  ALT 0 - 44 U/L 22   Edinburgh Score: No flowsheet data found.   After visit meds:  Allergies as of 03/30/2021      Reactions   Peanuts [peanut Oil]    Scratchy throat      Medication List    STOP taking these medications   Accu-Chek Guide w/Device Kit   Accu-Chek Softclix Lancets lancets   aspirin EC 81 MG tablet   glucose blood test strip     TAKE these medications   acetaminophen 500 MG tablet Commonly known as: TYLENOL Take 2  tablets (1,000 mg total) by mouth every 8 (eight) hours as needed for headache.   amLODipine 10 MG tablet Commonly known as: NORVASC Take 1 tablet (10 mg total) by mouth daily.   Blood Pressure Kit Devi Check blood pressure readings at home regularly o09.92   Gojji Weight Scale Misc 1 Device by Does not apply route as needed.   ibuprofen 600 MG tablet Commonly known as: ADVIL Take 1 tablet (600 mg total) by mouth every 6 (six) hours as needed.   labetalol 200 MG tablet Commonly known as: NORMODYNE Take 1 tablet (200 mg total) by mouth 2 (two) times daily.   multivitamin-prenatal 27-0.8 MG Tabs tablet Take 1 tablet by mouth daily at 12 noon.      Discharge home in stable condition Infant Feeding: Breast Infant Disposition:home with mother Discharge instruction: per After Visit Summary and Postpartum booklet. Activity: Advance as tolerated. Pelvic rest for 6 weeks.  Diet: routine diet Future Appointments: Future Appointments  Date Time Provider [place]  04/03/2021 11:00 AM [place] None  04/11/2021  1:30 PM Lynnea Ferrier, LCSW [facility] None  05/08/2021  9:00 AM [facility] None  05/08/2021 10:45 AM Griffin Basil, MD [place] None   Follow up Visit:  [place] Follow up on 04/05/2021.   Specialty: Obstetrics and Gynecology Why: BP check Contact information: [address], [place] [phone]             Message sent to [facility] by Dr. Astrid Drafts.   Please schedule this patient for a In person postpartum visit in 6 weeks with the following provider: Any provider. Additional Postpartum F/U:Postpartum Depression checkup and BP check 1 week, 2hr gtt at postpartum appt High risk pregnancy complicated by: severe preeclampsia, IUGR, A1GDM, baby in NICU Delivery mode:  Vaginal, Spontaneous  Anticipated Birth Control:  Unsure   03/30/2021 Emeterio Reeve, MD

## 2021-03-27 NOTE — Progress Notes (Signed)
Labor Progress Note Jacqueline Yu is a 20 y.o. G2P0010 at [redacted]w[redacted]d presented for IOL-preE w/SF. S: Doing well without complaints.  O:  BP (!) 171/98   Pulse 78   Temp 98.6 F (37 C) (Axillary)   Resp 18   Ht 5\' 4"  (1.626 m)   Wt 106.8 kg   LMP 07/15/2020   SpO2 100%   BMI 40.41 kg/m  EFM: baseline 120bpm/mod variability/+accels/variable decels with contractions Toco: q1-7 min  CVE: Dilation: 5 Effacement (%): 80 Cervical Position: Posterior Station: -1 Presentation: Vertex Exam by:: Dr. [email or web address]   [name]: 20 y.o. G2P0010 [redacted]w[redacted]d presented for IOL-preE w/ SF. #IOL: IOL started 5/13 with cyto x2. FB dislodged @0015  on 5/14. Pitocin started @0335  5/14 and patient made minimal cervical change so pitocin was stopped at 0900 and cyto re-dosed x4. Pitocin restarted today @0415 . AROM/IUPC placed @1030 . FSE placed ~1500 with some decels and pitocin was paused x1 hour at that time and then restarted. Patient has made minimal cervical change so pitocin was stopped and cytotec was re-dosed @2030 . Will start amnioinfusion in the setting of recurrent variables. Will also start pitocin when FHT recovered. #Pain: epidural #FWB: cat 2, will do position changes, LR bolus, and amnioinfusion and continue to monitor. #GBS negative culture 5/13 on admit, PCN stopped #preE w/ SF: asymptomatic. BP severe range, has been receiving labetalol per anti-htn protocol. Labetalol PO 200 mg BID started 5-16. On Mg. preE labs demonstrated p/c 6.24, otherwise LFTs and platelents wnl. Repeat preE labs pending. #A1GDM: not checking BGL prior. q4 in latent labor and q2 in active labor. EFW 2171g, 3.6%ile. #marginal cord insert: placenta to pathology  6/14, MD 12:29 AM

## 2021-03-27 NOTE — Progress Notes (Signed)
Labor Progress Note Jacqueline Yu is a 20 y.o. G2P0010 at [redacted]w[redacted]d presented for IOL-preE w/SF. S: Doing well without complaints.  O:  BP 132/82   Pulse 81   Temp 98.6 F (37 C) (Oral)   Resp 18   Ht 5\' 4"  (1.626 m)   Wt 106.8 kg   LMP 07/15/2020   SpO2 97%   BMI 40.41 kg/m  EFM: baseline 120bpm/mod variability/+accles/no decels Toco: q1-7 min  CVE: Dilation: 5 Effacement (%): 80 Cervical Position: Posterior Station: -1 Presentation: Vertex Exam by:: Dr. [email or web address]   [name]: 20 y.o. G2P0010 [redacted]w[redacted]d presented for IOL-preE w/ SF. #IOL: IOL started 5/13 with cyto x2. FB dislodged @0015  on 5/14. Pitocin started @0335  5/14 and patient made minimal cervical change so pitocin was stopped at 0900 and cyto re-dosed x4. Pitocin restarted @0415  on 5/15. AROM/IUPC placed @1030  on 5/15. FSE placed ~1500 with some decels and pitocin was paused x1 hour at that time and then restarted. Patient had made minimal cervical change so pitocin was stopped and cytotec was re-dosed @2030  on 5/15. Amnioinfusion was then started at 0030 on 5/16 due to recurrent variables with improvement in FHT. Pitocin was ultimately restarted @0220 , currently at 79mL/hr, continue to titrate, contractions not yet adequate. #Pain: epidural #FWB: cat 1 #GBS negative culture 5/13 on admit, PCN stopped #preE w/ SF: asymptomatic. BP severe range, has been receiving labetalol per anti-htn protocol. Labetalol PO 200 mg BID started 5-16. On Mg. preE labs demonstrated p/c 6.24, otherwise LFTs and platelents wnl. Repeat preE labs unremarkable, Mg level 5. #A1GDM: not checking BGL prior. q4 in latent labor and q2 in active labor. EFW 2171g, 3.6%ile. #marginal cord insert: placenta to pathology  , MD 5:23 AM

## 2021-03-28 LAB — CBC
HCT: 29.9 % — ABNORMAL LOW (ref 36.0–46.0)
Hemoglobin: 9.8 g/dL — ABNORMAL LOW (ref 12.0–15.0)
MCH: 28.8 pg (ref 26.0–34.0)
MCHC: 32.8 g/dL (ref 30.0–36.0)
MCV: 87.9 fL (ref 80.0–100.0)
Platelets: 307 10*3/uL (ref 150–400)
RBC: 3.4 MIL/uL — ABNORMAL LOW (ref 3.87–5.11)
RDW: 14.8 % (ref 11.5–15.5)
WBC: 9.8 10*3/uL (ref 4.0–10.5)
nRBC: 0 % (ref 0.0–0.2)

## 2021-03-28 LAB — GLUCOSE, CAPILLARY: Glucose-Capillary: 96 mg/dL (ref 70–99)

## 2021-03-28 MED ORDER — LABETALOL HCL 200 MG PO TABS
200.0000 mg | ORAL_TABLET | Freq: Two times a day (BID) | ORAL | Status: DC
  Administered 2021-03-28 – 2021-03-29 (×2): 200 mg via ORAL
  Filled 2021-03-28 (×2): qty 1

## 2021-03-28 NOTE — Anesthesia Postprocedure Evaluation (Signed)
Anesthesia Post Note  Patient: Jacqueline Yu  Procedure(s) Performed: AN AD HOC LABOR EPIDURAL     Patient location during evaluation: Mother Baby Anesthesia Type: Epidural Level of consciousness: awake and alert and oriented Pain management: satisfactory to patient Vital Signs Assessment: post-procedure vital signs reviewed and stable Respiratory status: respiratory function stable Cardiovascular status: stable Postop Assessment: no headache, no backache, epidural receding, patient able to bend at knees, no signs of nausea or vomiting, adequate PO intake and able to ambulate Anesthetic complications: no   No complications documented.  Last Vitals:  Vitals:   03/28/21 0749 03/28/21 1123  BP: 123/66 (!) 157/87  Pulse: 80 93  Resp: 16 16  Temp: 36.5 C 36.7 C  SpO2: 98% 100%    Last Pain:  Vitals:   03/28/21 1123  TempSrc: Oral  PainSc:    Pain Goal:                   Madison Direnzo

## 2021-03-28 NOTE — Lactation Note (Signed)
This note was copied from a baby's chart. Lactation Consultation Note  Patient Name: Jacqueline Yu [id number] Date: 03/28/2021 Reason for consult: Follow-up assessment;NICU baby;1st time breastfeeding;Primapara;Late-preterm 34-36.6wks;Infant < 6lbs;Maternal endocrine disorder Age:22 hours   LC in to visit with Mom, FOB in baby's room in the NICU.  Mom reclined with baby STS during gavage feeding.  Baby asleep, and assisted placing baby prone with head turned on Mom's chest.  Mom asked to be able to pump in baby's room.  LC set up DEBP in NICU. Encouraging Mom to pump both breasts on initiation setting after doing STS.  Encouraged Mom to pump every 3 hrs during the day and every 4 hrs at night.    Mom in contact with WIC about obtaining a DEBP when she is discharged.    Mom has pumped 4 times so far using the maintenance setting.  Reviewed initiation setting with Mom.  Mom aware of use this until expressing >20 ml times 3 pumps.  Encouraged Mom to ask for help prn.   Lactation Tools Discussed/Used Tools: Pump;Flanges Flange Size: 24 Breast pump type: Double-Electric Breast Pump Pumping frequency: 4 times so far Pumped volume: 0 mL  Interventions Interventions: Skin to skin;Breast massage;Hand express;Breast feeding basics reviewed;DEBP  Consult Status Consult Status: Follow-up Date: 03/29/21 Follow-up type: In-patient    Judee Clara 03/28/2021, 1:21 PM

## 2021-03-28 NOTE — Progress Notes (Signed)
Patient screened out for psychosocial assessment since none of the following apply:  Psychosocial stressors documented in mother or baby's chart  Gestation less than 32 weeks  Code at delivery   Infant with anomalies Please contact the Clinical Social Worker if specific needs arise, by MOB's request, or if MOB scores greater than 9/yes to question 10 on Edinburgh Postpartum Depression Screen.  CSW acknowledged that drug use is mentioned in infant's H&P; however, CSW could only see a history of marijuana use in 2020 documented in MOB's chart. CDS to be discontinued.   Celso Sickle, LCSW Clinical Social Worker [facility] Cell#: [phone]

## 2021-03-28 NOTE — Progress Notes (Signed)
Post Partum Day  # 1  SVD at 36 3/7 weeks with SPEC, A1GDM Subjective: Jacqueline Yu has no complaints this morning. Denies HA or visual changes. Tolerating diet. Pain controlled.  Objective: Blood pressure 138/82, pulse 70, temperature 97.6 F (36.4 C), temperature source Oral, resp. rate 15, height 5\' 4"  (1.626 m), weight 106.8 kg, last menstrual period 07/15/2020, SpO2 100 %, unknown if currently breastfeeding.  Physical Exam:  General: alert Lochia: appropriate Uterine Fundus: firm Incision: NA DVT Evaluation: No evidence of DVT seen on physical exam.  Recent Labs    03/27/21 1051 03/28/21 0612  HGB 12.1 9.8*  HCT 37.5 29.9*    Assessment/Plan: Will complete Magnesium this afternoon. BP stable on Norvasc, Fasting CBF 96. Continue with progressive care.     Patient desires circumcision for her female infant.  Circumcision procedure details discussed, risks and benefits of procedure were also discussed.  These include but are not limited to: Benefits of circumcision in men include reduction in the rates of urinary tract infection (UTI), penile cancer, some sexually transmitted infections, penile inflammatory and retractile disorders, as well as easier hygiene.  Risks include bleeding , infection, injury of glans which may lead to penile deformity or urinary tract issues, unsatisfactory cosmetic appearance and other potential complications related to the procedure.  It was emphasized that this is an elective procedure.  Patient wants to proceed with circumcision; written informed consent obtained.  Will do circumcision soon, routine circumcision and post circumcision care ordered for the infant.  Jacqueline Yu L. 03/30/21, M.D. 03/28/2021 7:29 AM      LOS: 4 days   03/30/2021 03/28/2021, 7:26 AM

## 2021-03-28 NOTE — Lactation Note (Signed)
This note was copied from a baby's chart. Lactation Consultation Note  Patient Name: Jacqueline Yu [id number] Date: 03/28/2021   Age:22 hours  LC visit attempted in Mom's postpartum room, 113, but Mom was not there. NICU LC will attempt to see Mom in the NICU.  Mom noted to be on amlodipine 10 mg qd.  Lurline Hare [facility] 03/28/2021, 1:01 PM

## 2021-03-29 ENCOUNTER — Other Ambulatory Visit (HOSPITAL_COMMUNITY): Payer: Medicaid Other | Attending: Family Medicine

## 2021-03-29 ENCOUNTER — Other Ambulatory Visit (HOSPITAL_COMMUNITY): Payer: Self-pay

## 2021-03-29 LAB — SURGICAL PATHOLOGY

## 2021-03-29 MED ORDER — AMLODIPINE BESYLATE 10 MG PO TABS
10.0000 mg | ORAL_TABLET | Freq: Every day | ORAL | 0 refills | Status: AC
  Filled 2021-03-29: qty 30, 30d supply, fill #0

## 2021-03-29 MED ORDER — IBUPROFEN 600 MG PO TABS
600.0000 mg | ORAL_TABLET | Freq: Four times a day (QID) | ORAL | 0 refills | Status: AC | PRN
  Filled 2021-03-29: qty 30, 8d supply, fill #0

## 2021-03-29 MED ORDER — LABETALOL HCL 200 MG PO TABS
200.0000 mg | ORAL_TABLET | Freq: Two times a day (BID) | ORAL | 0 refills | Status: DC
  Filled 2021-03-29: qty 60, 30d supply, fill #0

## 2021-03-29 MED ORDER — LABETALOL HCL 200 MG PO TABS
300.0000 mg | ORAL_TABLET | Freq: Two times a day (BID) | ORAL | Status: DC
  Administered 2021-03-29 – 2021-03-30 (×2): 300 mg via ORAL
  Filled 2021-03-29 (×2): qty 1

## 2021-03-29 NOTE — Lactation Note (Signed)
This note was copied from a baby's chart. Lactation Consultation Note  Patient Name: Jacqueline Yu [id number] Date: 03/29/2021 Reason for consult: Follow-up assessment;NICU baby Age:22 hours  Follow up visit to P1 mother of 66 hours old infant currently in NICU. Mother states she has been pumping but able to collect only drops of EBM. Mother reports pumping every 3 hours. Discussed the importance of breast stimulation, self-care and its influence to milk supply. Encouraged mother to continue pumping at least 8 times a day including night time. Reviewed hand expression and able to express colostrum easily. Encouraged to request [facility] for any needs, support or questions. Praised mother for her effort and dedication.   Maternal Data Has patient been taught Hand Expression?: Yes  Feeding Mother's Current Feeding Choice: Breast Milk and Donor Milk Nipple Type: Nfant Extra Slow Flow (gold)  Lactation Tools Discussed/Used Tools: Pump Flange Size: 24 Breast pump type: Double-Electric Breast Pump;Manual Reason for Pumping: NICU baby Pumping frequency: Q3 Pumped volume:  (mother reports only drops)  Interventions Interventions: Breast feeding basics reviewed;Education;Expressed milk;Hand express;Breast massage;DEBP;Hand pump  Consult Status Consult Status: Follow-up Date: 03/30/21 Follow-up type: In-patient    Jacqueline Yu 03/29/2021, 11:36 AM

## 2021-03-29 NOTE — Progress Notes (Signed)
POSTPARTUM PROGRESS NOTE  PPD #2  Subjective:  Jacqueline Yu is a 20 y.o. [id number] s/p SVD at [redacted]w[redacted]d. Today she notes that overall she is doing ok. She denies any problems with ambulating, voiding or po intake. Denies nausea or vomiting. She has + flatus, + BM.  Pain is controlled with current medication.  Lochia minimal to moderate Denies fever/chills/chest pain/SOB.  no HA, no blurry vision, noRUQ pain  Objective: Blood pressure (!) 139/92, pulse 100, temperature 98.5 F (36.9 C), temperature source Oral, resp. rate 16, height 5\' 4"  (1.626 m), weight 106.8 kg, last menstrual period 07/15/2020, SpO2 97 %, unknown if currently breastfeeding.  Physical Exam:  General: alert, cooperative and no distress Chest: no respiratory distress, CTAB Heart: regular rate and rhythm Abdomen: soft, nontender Uterine Fundus: firm, appropriately tender, below umbilicus DVT Evaluation: No calf swelling or tenderness Extremities: no edema Skin: warm, dry  No results found for this or any previous visit (from the past 24 hour(s)).  Assessment/Plan: Jacqueline Yu is a 20 y.o. [phone] s/p SVD at [redacted]w[redacted]d PPD#2 complicated by: 1) Preeclampsia with severe features -s/p Magnesium -currently on Norvasc 10mg  daily and Labetalol 200mg  bid  Meeting postpartum milestones appropriately, plan for discharge home later today if BP remains within normal limits  Contraception: undecided Feeding: breast, baby currently in NICU   [redacted]w[redacted]d, DO Faculty Attending, Center for [facility] 03/29/2021, 10:05 AM

## 2021-03-29 NOTE — Progress Notes (Signed)
Notified by RN regarding BPs.  BP still elevated 175/103, 156/87-Labetalol increased to 300mg  bid and plan to monitor for another day in-house.  Pt aware.  , DO Attending Obstetrician & Gynecologist, [facility] for [facility]

## 2021-03-29 NOTE — Lactation Note (Signed)
This note was copied from a baby's chart. Lactation Consultation Note  Patient Name: Jacqueline Yu [id number] Date: 03/29/2021 Age:22 hours  Mom is sleeping upon visit. LC will come back to room at another time as possible.     Vernecia Umble A Higuera Ancidey 03/29/2021, 8:50 AM

## 2021-03-30 ENCOUNTER — Other Ambulatory Visit (HOSPITAL_COMMUNITY): Payer: Self-pay

## 2021-03-30 MED ORDER — LABETALOL HCL 300 MG PO TABS
300.0000 mg | ORAL_TABLET | Freq: Two times a day (BID) | ORAL | 0 refills | Status: AC
  Filled 2021-03-30: qty 60, 30d supply, fill #0

## 2021-03-30 NOTE — Progress Notes (Signed)
Pt given discharge instructions. All questions answered. Pt verbalized understanding. Pt discharged in stable condition with all belongings.

## 2021-03-30 NOTE — Clinical Social Work Maternal (Signed)
CLINICAL SOCIAL WORK MATERNAL/CHILD NOTE  Patient Details  Name: Jacqueline Yu MRN: [id number] Date of Birth: 2001-08-29  Date:  03/30/2021  Clinical Social Worker Initiating Note:  Abundio Miu,  Date/Time: Initiated:  03/30/21/1228     Child's Name:  Jacqueline Yu   Biological Parents:  Mother,Father (Father: Clide Deutscher)   Need for Interpreter:  None   Reason for Referral:  [place] (Comment) (Infant's NICU admission;; Edinburgh score 16)   Address:  [place] [address]    Phone number:  [phone] (home)     Additional phone number:   Household Members/Support Persons (HM/SP):   Household Member/Support Person 1   HM/SP Name Relationship DOB or Age  HM/SP -1 Clide Deutscher FOB    HM/SP -2        HM/SP -3        HM/SP -4        HM/SP -5        HM/SP -6        HM/SP -7        HM/SP -8          Natural Supports (not living in the home):  Parent,Immediate Family,Extended Family   Professional Supports: None   Employment: Unemployed   Type of Work:     Education:  [place] arranged:    Museum/gallery curator Resources:  Medicaid   Other Resources:  [place] Considerations Which May Impact Care:    Strengths:  Ability to meet basic needs ,Pediatrician chosen,Home prepared for child ,Understanding of illness   Psychotropic Medications:         Pediatrician:    [place]  Pediatrician List:   [place]  [place]      Pediatrician Fax Number:    Risk Factors/Current Problems:  Mental Health Concerns    Cognitive State:  Able to Concentrate ,Alert ,Goal Oriented ,Insightful ,Linear Thinking    Mood/Affect:  Calm ,Interested ,Relaxed    CSW Assessment: CSW met with MOB at bedside to discuss infant's NICU admission  and behavioral health concerns (edinburgh score 16). MOB was sitting in recliner and feeding infant. CSW introduced self and explained reason for consult. MOB was welcoming, open and remained engaged during assessment. MOB reported that she resides with FOB and receives both [facility] and food stamps. MOB reported that she has all items needed to care for infant including a car seat and basinet. MOB reported that she is unsure if the car seat will be too big for infant, CSW encouraged MOB to bring in car seat for staff to check. CSW inquired about MOB's support system, MOB reported that FOB, her mom, brother and other family members are supports.   CSW inquired about MOB's mental health history. MOB denied any mental health history. CSW and MOB discussed edinburgh score 16. MOB attributed edinburgh score to infant's NICU admission, not wanting to leave infant and guilt about having infant early. CSW acknowledged and validated MOB's feelings. CSW and MOB discussed emotions associated with having an infant admitted to the NICU. CSW encouraged MOB to rely on her supports for support and to engage in healthy behaviors (getting rest, eating well balanced meals and getting sunlight). CSW normalized the difficulties associated with leaving infant in the NICU. CSW inquired about how MOB  was feeling emotionally since giving birth, MOB reported that she was feeling okay a little. MOB spoke about her guilty feelings surrounding having infant early. CSW reassured MOB that she should not blame herself for infant's early arrival and discussed the importance of challenging guilty thoughts when they happen, MOB agreed. MOB presented calm and did not demonstrate any acute mental health signs/symptoms. CSW assessed for safety, MOB denied SI, HI and domestic violence.   CSW provided education regarding the baby blues period vs. perinatal mood disorders, discussed treatment and gave resources for mental health follow up if concerns  arise.  CSW recommends self-evaluation during the postpartum time period using the New Mom Checklist from Postpartum Progress and encouraged MOB to contact a medical professional if symptoms are noted at any time.    CSW provided review of Sudden Infant Death Syndrome (SIDS) precautions.    CSW informed MOB about the NICU, what to expect and resources/supports available while infant is admitted to the NICU. MOB reported that she feels well informed about infant's care. MOB reported that she may have transportation barriers with visiting infant in the NICU. CSW informed MOB about Medicaid transportation, MOB reported that she was interested. CSW provided MOB with contact information for medicaid transportation. MOB denied any questions/concerns regarding the NICU. MOB thanked CSW for visit.   CSW will continue to offer resources/supports while infant is admitted to the NICU.    CSW Plan/Description:  Sudden Infant Death Syndrome (SIDS) Education,Perinatal Mood and Anxiety Disorder (PMADs) Education,Psychosocial Support and Ongoing Assessment of Needs,Other Patient/Family Education,Other Information/Referral to [employer], LCSW 03/30/2021, 12:36 PM

## 2021-03-30 NOTE — Lactation Note (Signed)
This note was copied from a baby's chart. Lactation Consultation Note  Patient Name: Jacqueline Yu [id number] Date: 03/30/2021 Reason for consult: Follow-up assessment;NICU baby;Late-preterm 34-36.6wks;Maternal endocrine disorder Age:22 hours   LC in to visit with P1 Mom on day of her discharge from [facility].  WIC pump to be picked up at 3pm. Mom states she has been pumping consistently and obtaining drops. Mom states she is leaking from her breast now.   Mom states she is latching baby in the NICU.  Recommended she ask her baby's RN to call lactation for assistance.  Mom knows she can pump in baby's room as she has full set up.  Mom will be pumping after baby's breastfeeds to support a full milk supply.  Lactation Tools Discussed/Used Tools: Pump;Flanges Flange Size: 24 Breast pump type: Double-Electric Breast Pump Pumping frequency: Q 3hrs Pumped volume: 0 mL (drops) Discharge Discharge Education: Engorgement and breast care Pump: DEBP (from [facility], obtaining this at 3 pm today) WIC Program: Yes  Consult Status Consult Status: Follow-up Date: 04/01/21 Follow-up type: In-patient    Judee Clara 03/30/2021, 8:47 AM

## 2021-03-30 NOTE — Progress Notes (Signed)
Pt set up with babyscripts app. Pt given BP cuff with monitor. Pt demonstrated obtaining her BP as well as putting results in the app. BP then verified in epic. Pt educated to check her BP twice a day and to monitor symptoms in the app. Pt verbalized understanding. All questions answered.

## 2021-03-31 ENCOUNTER — Inpatient Hospital Stay (HOSPITAL_COMMUNITY)
Admission: AD | Admit: 2021-03-31 | Payer: Medicaid Other | Source: Home / Self Care | Admitting: Obstetrics & Gynecology

## 2021-03-31 ENCOUNTER — Inpatient Hospital Stay (HOSPITAL_COMMUNITY): Payer: Medicaid Other

## 2021-04-01 ENCOUNTER — Ambulatory Visit: Payer: Self-pay

## 2021-04-01 NOTE — Lactation Note (Signed)
This note was copied from a baby's chart. Lactation Consultation Note LC to room for f/u visit. Mother bf prior to [facility] arrival. Pecola Leisure was asleep during visit. Mother is pumping 3xday. We reviewed feeding norms at 37 weeks. LC encouraged increasing frequency of pumping. LC will plan return visit to assist with bf.   Patient Name: Jacqueline Yu [id number] Date: 04/01/2021 Reason for consult: NICU baby;Follow-up assessment Age:22 days  Feeding Mother's Current Feeding Choice: Breast Milk and Donor Milk Nipple Type: Dr. Levert Feinstein Preemie  LATCH Score Latch: Repeated attempts needed to sustain latch, nipple held in mouth throughout feeding, stimulation needed to elicit sucking reflex.  Audible Swallowing: A few with stimulation  Type of Nipple: Everted at rest and after stimulation  Comfort (Breast/Nipple): Soft / non-tender  Hold (Positioning): Assistance needed to correctly position infant at breast and maintain latch.  LATCH Score: 7  Interventions Interventions: Pre-pump if needed;Expressed milk;Breast feeding basics reviewed;Education;DEBP   Consult Status Consult Status: Follow-up Follow-up type: In-patient   Elder Negus, MA IBCLC 04/01/2021, 2:17 PM

## 2021-04-03 ENCOUNTER — Ambulatory Visit (INDEPENDENT_AMBULATORY_CARE_PROVIDER_SITE_OTHER): Payer: Medicaid Other

## 2021-04-03 ENCOUNTER — Other Ambulatory Visit: Payer: Self-pay

## 2021-04-03 DIAGNOSIS — Z0130 Encounter for examination of blood pressure without abnormal findings: Secondary | ICD-10-CM

## 2021-04-03 NOTE — Progress Notes (Signed)
Patient was assessed and managed by nursing staff during this encounter. I have reviewed the chart and agree with the documentation and plan. I have also made any necessary editorial changes.  Danelle Curiale, MD 04/03/2021 2:55 PM    

## 2021-04-03 NOTE — Progress Notes (Signed)
Subjective:  Jacqueline Yu is a 20 y.o. female here for BP check.   Hypertension ROS: taking medications as instructed, no medication side effects noted, no TIA's, no chest pain on exertion, no dyspnea on exertion, no swelling of ankles and noting swelling of ankles.    Objective:  LMP 07/15/2020   Appearance alert, well appearing, and in no distress. General exam BP noted to be well controlled today in office.    Assessment:   Blood Pressure stable.   Plan:  Current treatment plan is effective, no change in therapy.Marland Kitchen

## 2021-04-11 ENCOUNTER — Encounter: Payer: Medicaid Other | Admitting: Licensed Clinical Social Worker

## 2021-05-08 ENCOUNTER — Encounter: Payer: Self-pay | Admitting: Obstetrics and Gynecology

## 2021-05-08 ENCOUNTER — Other Ambulatory Visit: Payer: Self-pay

## 2021-05-08 ENCOUNTER — Other Ambulatory Visit: Payer: Medicaid Other

## 2021-05-08 ENCOUNTER — Ambulatory Visit (INDEPENDENT_AMBULATORY_CARE_PROVIDER_SITE_OTHER): Payer: Medicaid Other | Admitting: Obstetrics and Gynecology

## 2021-05-08 NOTE — Progress Notes (Signed)
Post Partum Visit Note  Jacqueline Yu is a 20 y.o. [phone] female who presents for a postpartum visit. She is 6 weeks postpartum following a normal spontaneous vaginal delivery.  I have fully reviewed the prenatal and intrapartum course. The delivery was at 36 gestational weeks.  Anesthesia: epidural. Postpartum course has been uncomplicated. Baby is doing well. Baby is feeding by both breast and bottle - Similac . Bleeding staining only. Bowel function is abnormal: constipation, large stools . Bladder function is normal. Patient is not sexually active. Contraception method is none. Postpartum depression screening: negative.  Pt does note some issues with constipation.  Advised increased hydration, fiber laxatives and colace as a stool softener.   The pregnancy intention screening data noted above was reviewed. Potential methods of contraception were discussed. The patient elected to proceed with Female Condom.     Health Maintenance Due  Topic Date Due   PNEUMOCOCCAL POLYSACCHARIDE VACCINE AGE 32-64 HIGH RISK  Never done   COVID-19 Vaccine (1) Never done   FOOT EXAM  Never done   OPHTHALMOLOGY EXAM  Never done   URINE MICROALBUMIN  Never done   HPV VACCINES (1 - 2-dose series) Never done   HEMOGLOBIN A1C  09/28/2020    The following portions of the patient's history were reviewed and updated as appropriate: allergies, current medications, past family history, past medical history, past social history, past surgical history, and problem list.  Review of Systems Pertinent items are noted in HPI.  Objective:  BP 126/82   Pulse 93   Ht 5\' 4"  (1.626 m)   Wt 218 lb (98.9 kg)   LMP 07/15/2020   BMI 37.42 kg/m    General:  alert, cooperative, and no distress   Breasts:  not indicated  Lungs: clear to auscultation bilaterally  Heart:  regular rate and rhythm  Abdomen: soft, non-tender; bowel sounds normal; no masses,  no organomegaly   Wound N/a  GU exam:   Pt declined        Assessment:    Encounter for postpartum care normal postpartum exam.   Plan:   Essential components of care per ACOG recommendations:  1.  Mood and well being: Patient with negative depression screening today. Reviewed local resources for support.  - Patient tobacco use? No.   - hx of drug use? No.    2. Infant care and feeding:  -Patient currently breastmilk feeding? Yes. Discussed returning to work and pumping. Reviewed importance of draining breast regularly to support lactation.  -Social determinants of health (SDOH) reviewed in EPIC. No concerns  3. Sexuality, contraception and birth spacing - Patient does not want a pregnancy in the next year.  Desired family size is 2 children.  - Reviewed forms of contraception in tiered fashion. Patient desired condoms today.   - Discussed birth spacing of 18 months  4. Sleep and fatigue -Encouraged family/partner/community support of 4 hrs of uninterrupted sleep to help with mood and fatigue  5. Physical Recovery  - Discussed patients delivery and complications. She describes her labor as good. - Patient had a Vaginal, no problems at delivery. Patient had   no  laceration. Perineal healing reviewed. Patient expressed understanding - Patient has urinary incontinence? No. - Patient is safe to resume physical and sexual activity  6.  Health Maintenance - HM due items addressed Yes - Last pap smear  Diagnosis  Date Value Ref Range Status  09/30/2020 (A)  Final   - Atypical squamous cells of undetermined  significance (ASC-US)   Pap smear not done at today's visit. Current guidelines reviewed, advised repap in 1 year, pt will get annual and pap 10/22  7. Chronic Disease/Pregnancy Condition follow up: Hypertension and Gestational Diabetes BP check in 1 week without meds, 2 hour GTT today  Warden Fillers, MD Center for [employer], [facility]

## 2021-05-09 LAB — GLUCOSE TOLERANCE, 2 HOURS
Glucose, 2 hour: 87 mg/dL (ref 65–139)
Glucose, GTT - Fasting: 98 mg/dL (ref 65–99)

## 2021-05-17 ENCOUNTER — Other Ambulatory Visit: Payer: Self-pay

## 2021-05-17 ENCOUNTER — Ambulatory Visit (INDEPENDENT_AMBULATORY_CARE_PROVIDER_SITE_OTHER): Payer: Medicaid Other

## 2021-05-17 DIAGNOSIS — Z0130 Encounter for examination of blood pressure without abnormal findings: Secondary | ICD-10-CM

## 2021-05-17 NOTE — Progress Notes (Signed)
Subjective:  Jacqueline Yu is a 20 y.o. female here for BP check.   Hypertension ROS: taking medications as instructed, no medication side effects noted, no TIA's, no chest pain on exertion, no dyspnea on exertion, and no swelling of ankles.    Objective:  BP 129/86   Pulse 81   Ht 5\' 4"  (1.626 m)   Wt 213 lb (96.6 kg)   BMI 36.56 kg/m   Appearance alert, well appearing, and in no distress. General exam BP noted to be well controlled today in office.    Assessment:   Blood Pressure well controlled.   Plan:  Current treatment plan is effective, no change in therapy.

## 2021-05-18 NOTE — Progress Notes (Signed)
Patient was assessed and managed by nursing staff during this encounter. I have reviewed the chart and agree with the documentation and plan. I have also made any necessary editorial changes.  Shaneisha Burkel, MD 05/18/2021 10:20 AM   

## 2021-09-28 IMAGING — US US OB < 14 WEEKS - US OB TV
1 series · 15 of 28 positions shown · non-contrast
Comparison: None.

CLINICAL DATA: Vaginal bleeding

EXAM:
OBSTETRIC <14 WK US AND TRANSVAGINAL OB US
TECHNIQUE: Both transabdominal and transvaginal ultrasound examinations were
performed for complete evaluation of the gestation as well as the
maternal uterus, adnexal regions, and pelvic cul-de-sac.
Transvaginal technique was performed to assess early pregnancy.

[Series 1: us ob < 14 weeks - us ob tv · 15 of 45 slices shown]
[im 1/45]
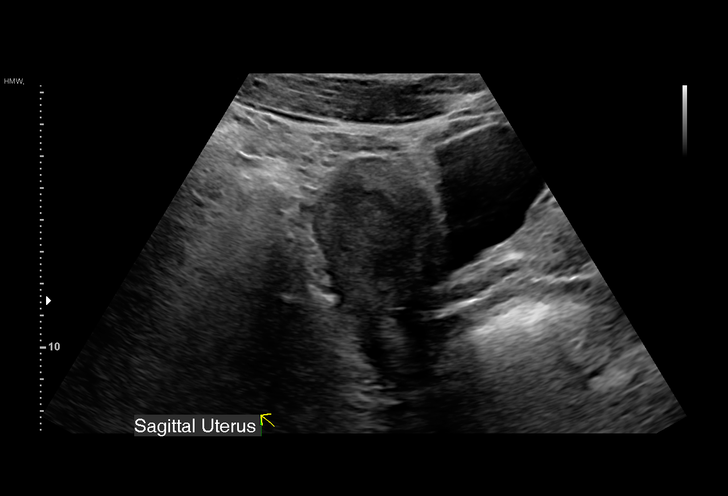
[im 4/45]
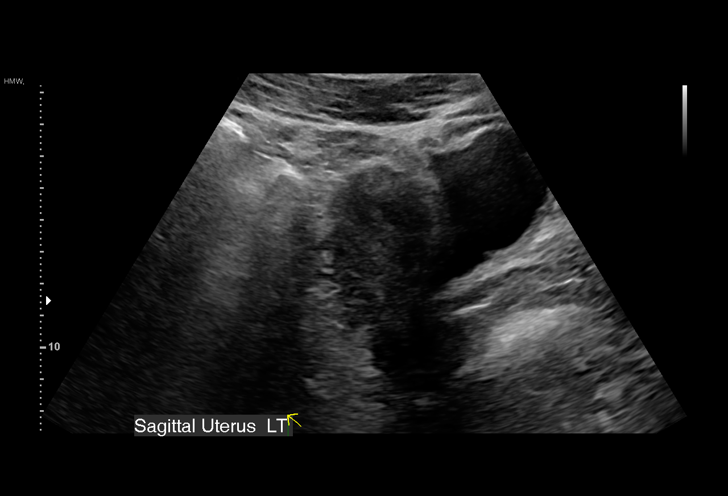
[im 7/45]
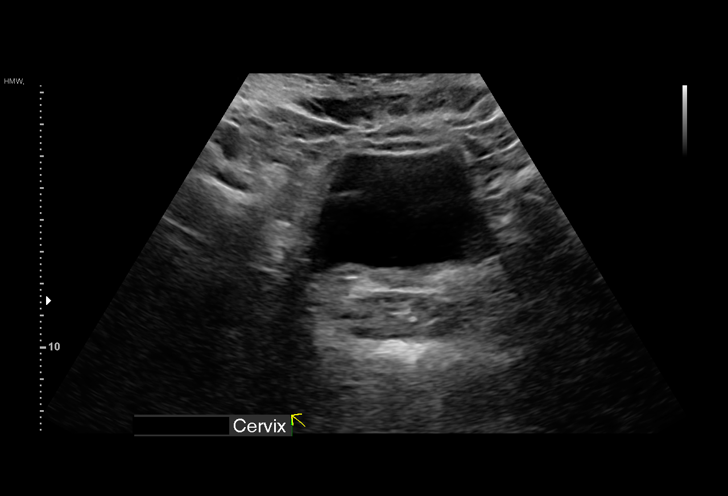
[im 10/45]
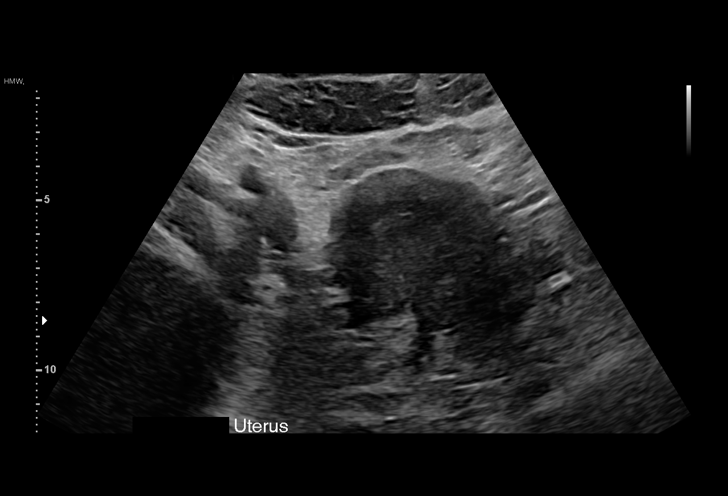
[im 14/45]
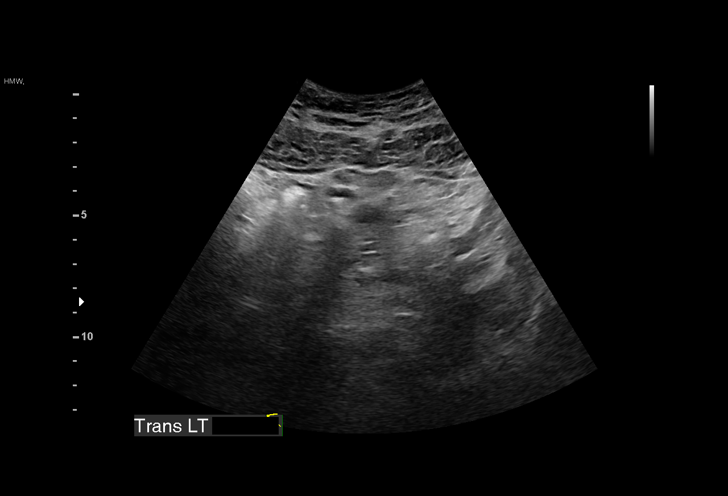
[im 17/45]
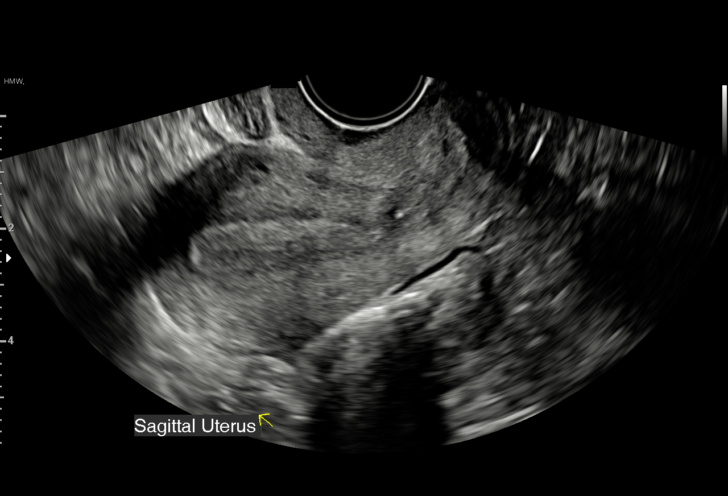
[im 20/45]
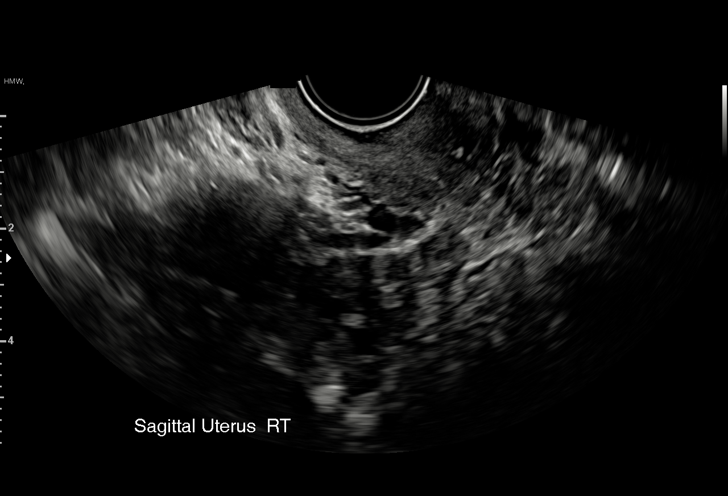
[im 23/45]
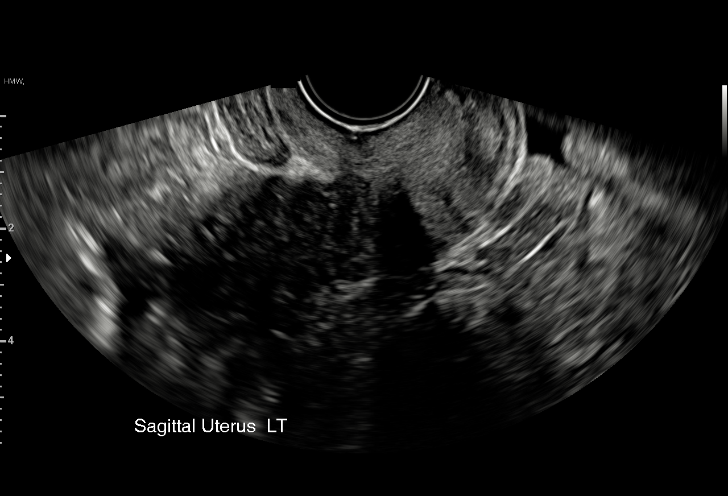
[im 25/45]
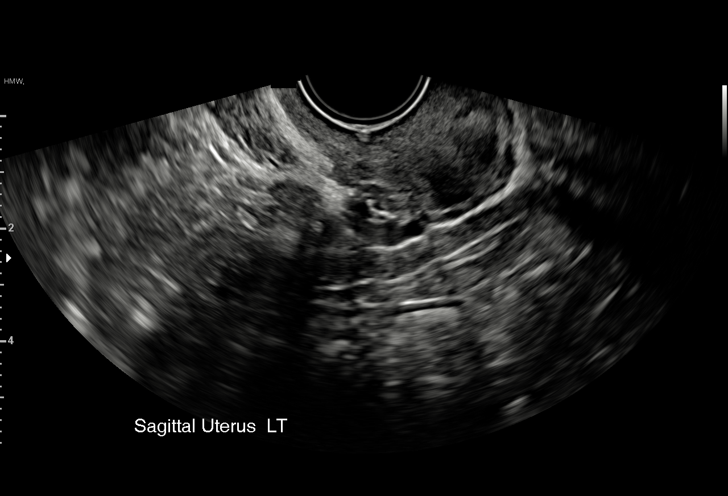
[im 28/45]
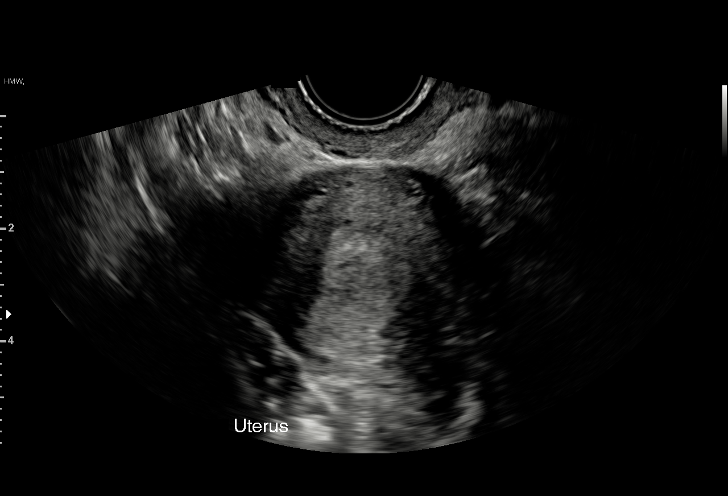
[im 31/45]
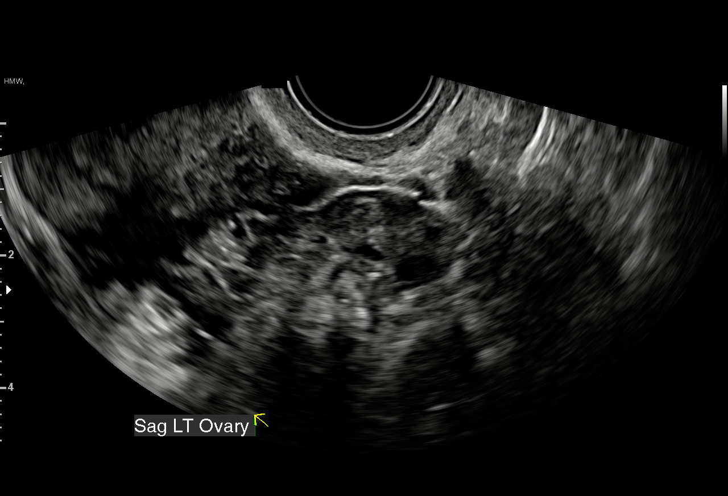
[im 35/45]
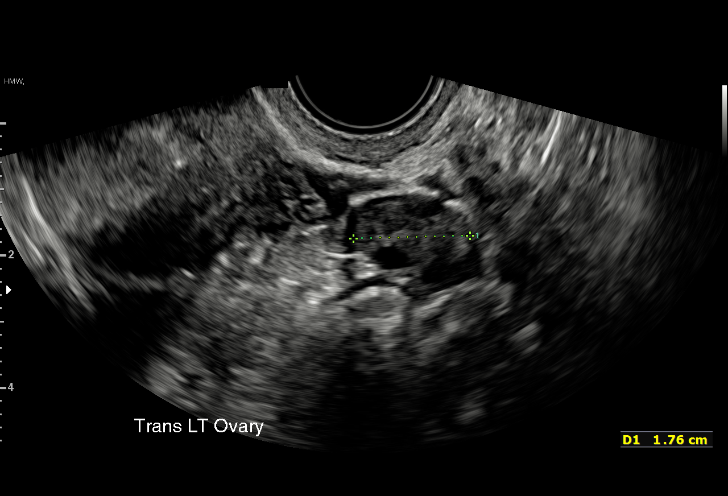
[im 38/45]
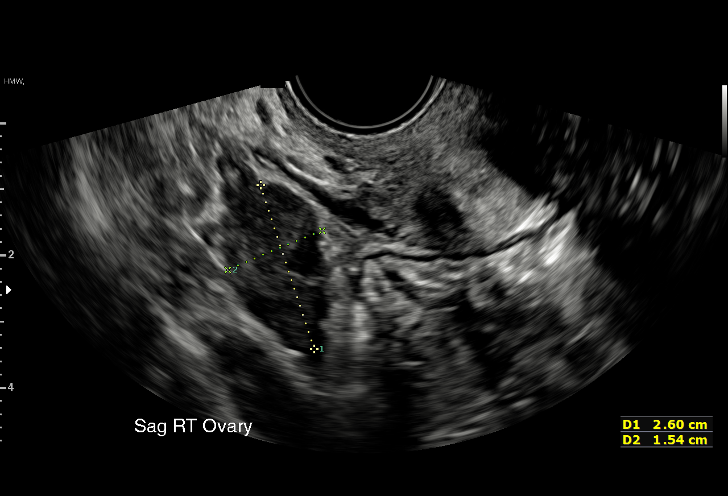
[im 41/45]
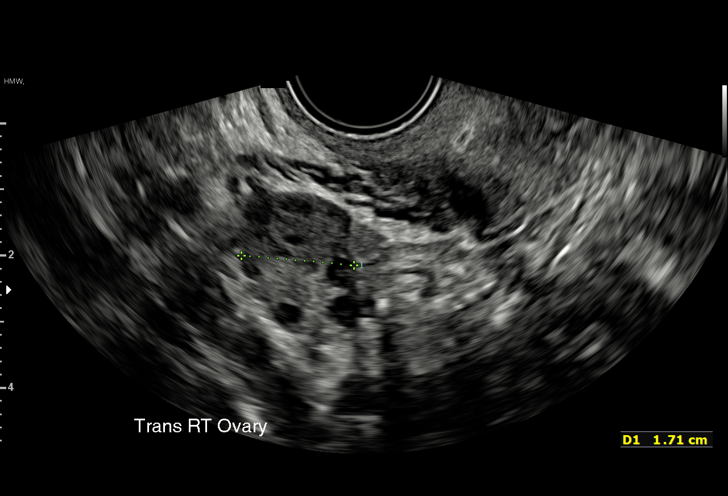
[im 45/45]
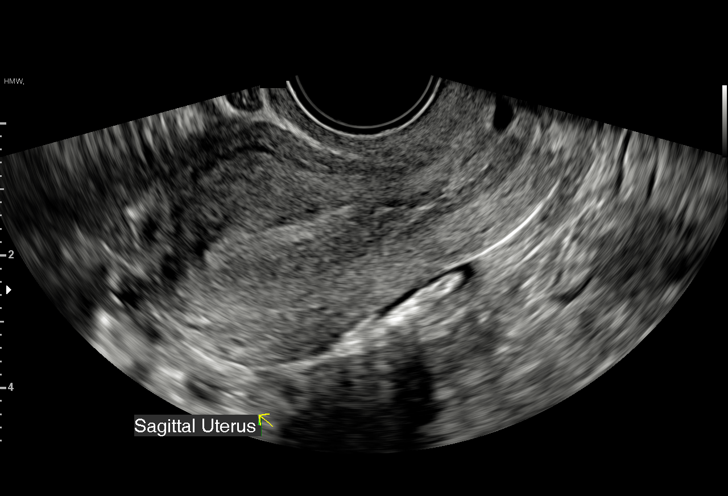

[15 of 28 positions shown; findings below may reference images not displayed]

FINDINGS: Intrauterine gestational sac: Not visualized

Endometrium: 8 mm in thickness.

Right ovary: Several scattered follicles are seen. The RIGHT ovary
measures 2.6 x 1.5 x 1.7 cm.

Left ovary: Several scattered follicles are seen. The LEFT ovary
measures 1.8 by 2.1 x 1.3 cm.

Other :Incidental note of nabothian cysts.

Free fluid:  None
IMPRESSION: No intrauterine gestational sac, yolk sac, or fetal pole identified.
In the setting of positive pregnancy test and no definite
intrauterine pregnancy, this reflects a pregnancy of unknown
location. Differential considerations include early normal IUP,
abnormal IUP, or nonvisualized ectopic pregnancy. Differentiation is
achieved with serial beta HCG supplemented by repeat sonography as
clinically warranted.

## 2022-03-24 IMAGING — US US MFM OB DETAIL+14 WK
1 series · 12 of 28 positions shown · non-contrast
Comparison: none

[Series 1: us mfm ob detail+14 wk · 62 acquisitions, 12 frames shown]
[im 3/62]
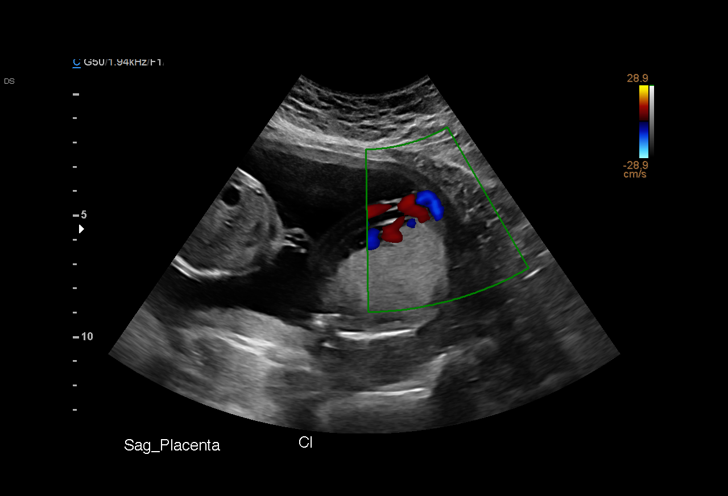
[im 7/62]
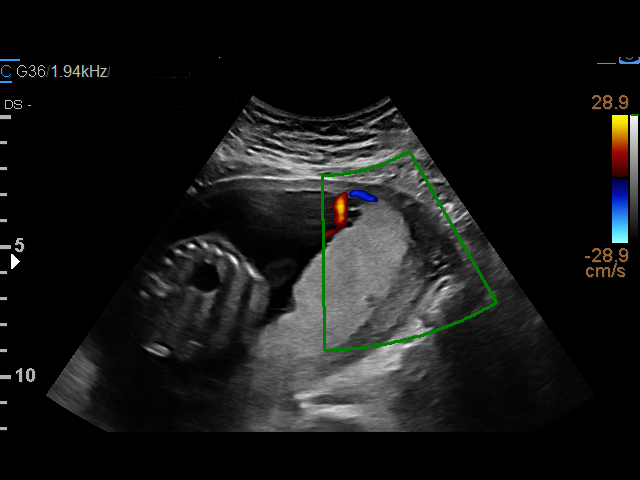
[im 12/62]
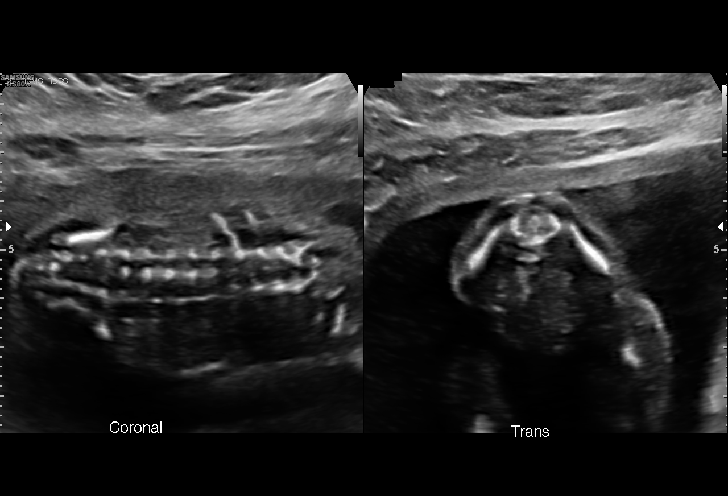
[im 19/62]
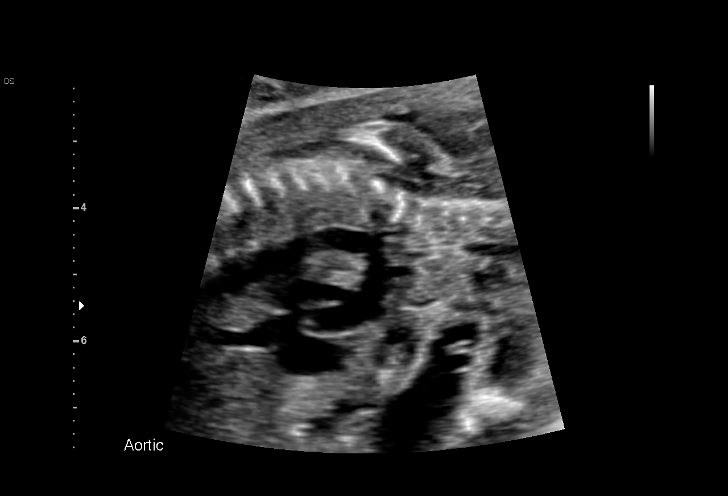
[im 23/62]
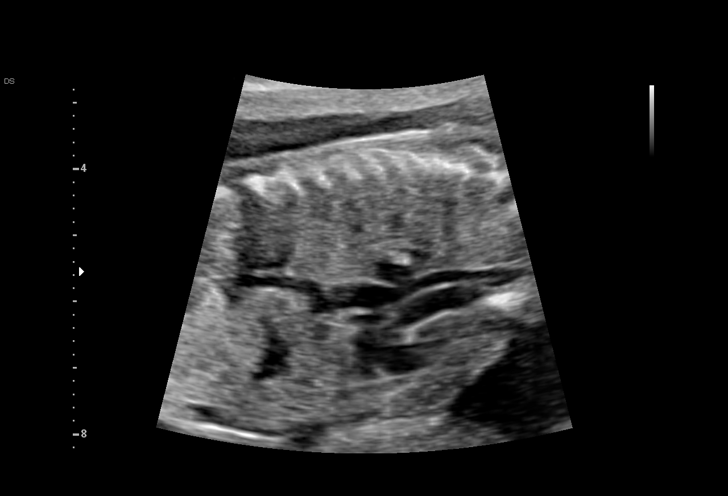
[im 28/62]
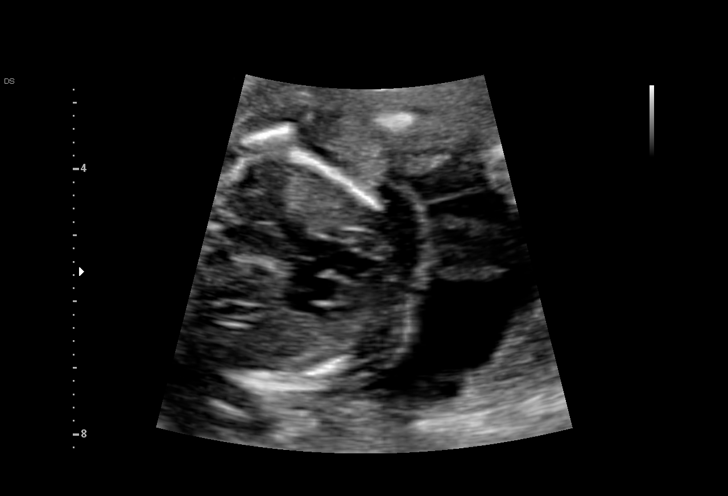
[im 34/62]
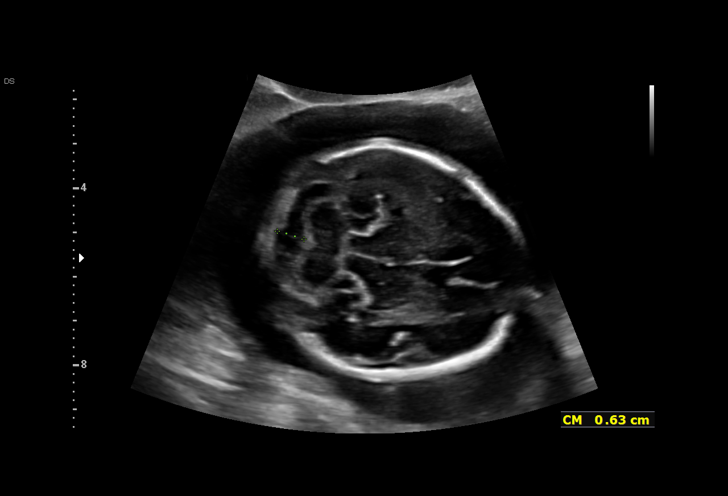
[im 39/62]
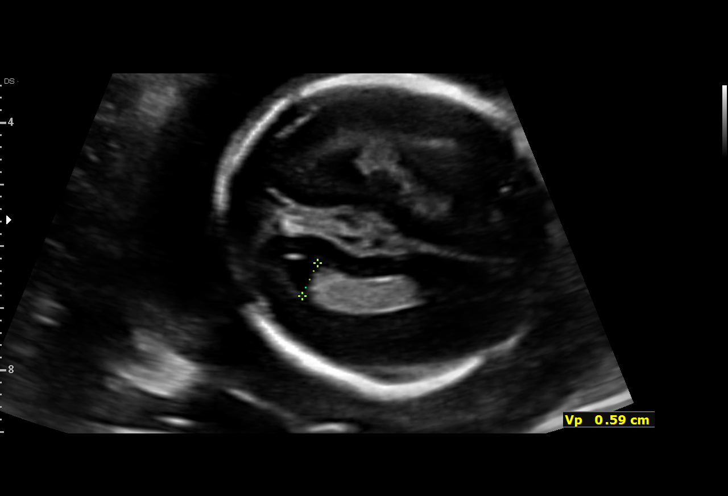
[im 43/62]
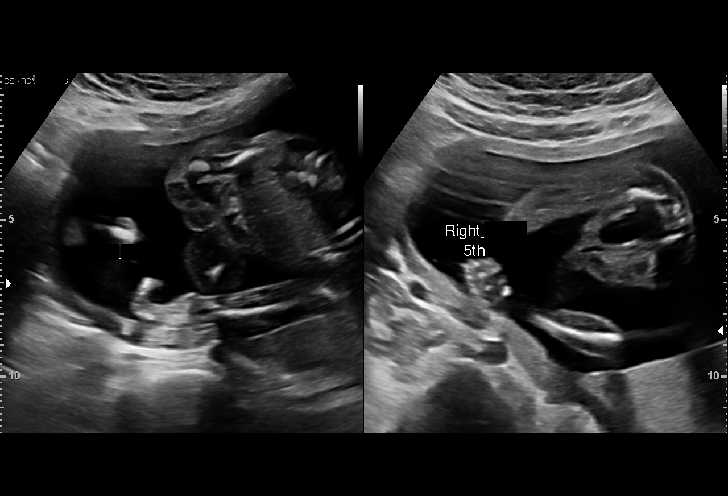
[im 50/62]
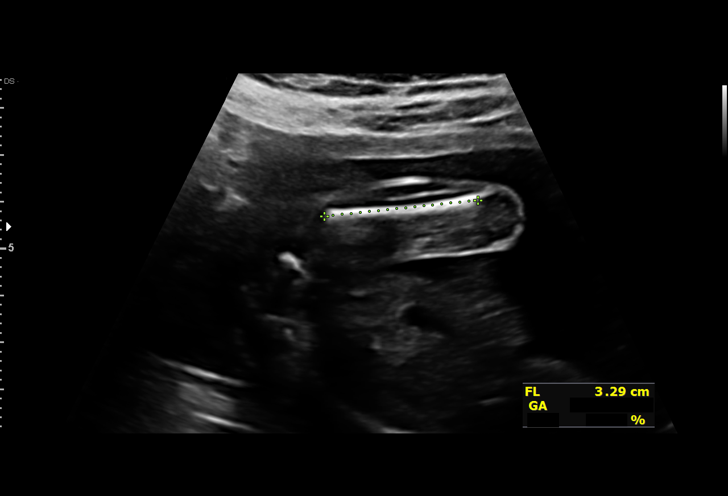
[im 55/62]
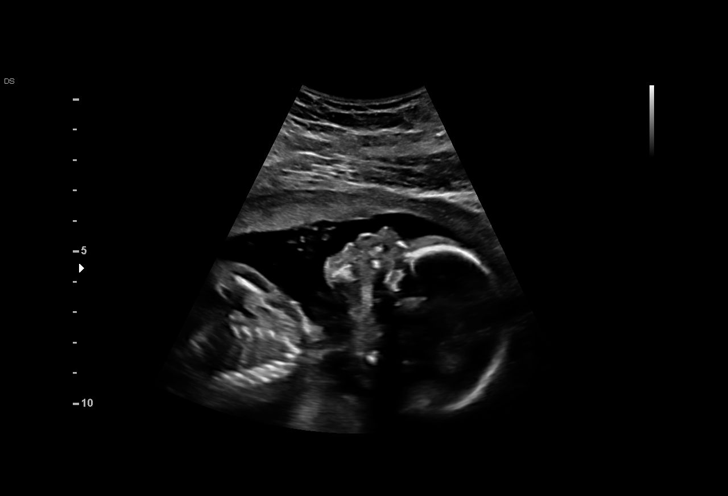
[im 59/62]
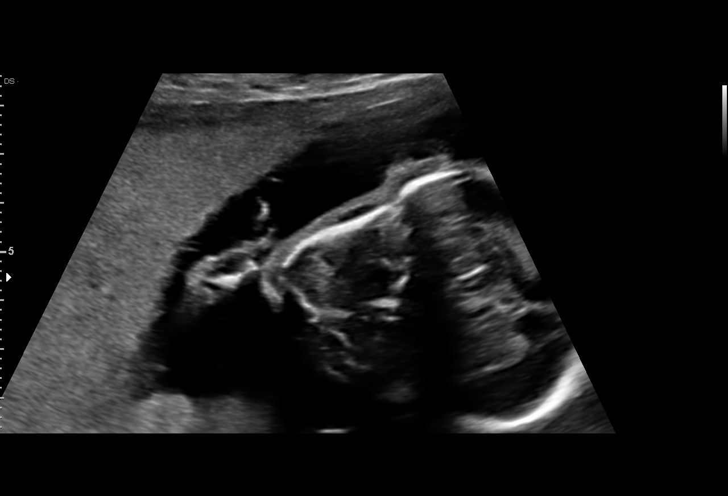

[12 of 28 positions shown; findings below may reference images not displayed]

Medicine Center
                                                            [id number]
                                                            [place]

Indications

 Obesity complicating pregnancy, second
 trimester
 Gestational diabetes in pregnancy, diet
 controlled
 Encounter for antenatal screening for
 malformations
 21 weeks gestation of pregnancy
 Quad - neg
 Marginal insertion of umbilical cord affecting
 management of mother in second trimester
Fetal Evaluation

 Num Of Fetuses:         1
 Cardiac Activity:       Observed
 Presentation:           Cephalic
 Placenta:               Posterior
 P. Cord Insertion:      Marginal insertion

 Amniotic Fluid
 AFI FV:      Within normal limits
Biometry
 BPD:      50.3  mm     G. Age:  21w 2d         59  %    CI:        78.48   %    70 - 86
                                                         FL/HC:      18.6   %    15.9 -
 HC:      179.6  mm     G. Age:  20w 3d         17  %    HC/AC:      1.22        1.06 -
 AC:      147.8  mm     G. Age:  20w 0d         16  %    FL/BPD:     66.4   %
 FL:       33.4  mm     G. Age:  20w 3d         23  %    FL/AC:      22.6   %    20 - 24
 HUM:      32.8  mm     G. Age:  21w 0d         48  %
 CER:      21.5  mm     G. Age:  20w 2d         39  %
 NFT:      52.1  mm

 LV:        5.9  mm
 CM:        6.3  mm

 Est. FW:     344  gm    0 lb 12 oz      14  %
OB History

 Gravidity:    2         Term:   0         SAB:   1
 Living:       0
Gestational Age

 LMP:           21w 0d        Date:  07/15/20                 EDD:   04/21/21
 U/S Today:     20w 4d                                        EDD:   04/24/21
 Best:          21w 0d     Det. By:  LMP  (07/15/20)          EDD:   04/21/21
Anatomy

 Cranium:               Appears normal         LVOT:                   Appears normal
 Cavum:                 Appears normal         Aortic Arch:            Appears normal
 Ventricles:            Appears normal         Ductal Arch:            Appears normal
 Choroid Plexus:        Appears normal         Diaphragm:              Appears normal
 Cerebellum:            Appears normal         Stomach:                Appears normal, left
                                                                       sided
 Posterior Fossa:       Appears normal         Abdomen:                Appears normal
 Nuchal Fold:           Not applicable (>20    Abdominal Wall:         Appears nml (cord
                        wks GA)                                        insert, abd wall)
 Face:                  Appears normal         Cord Vessels:           Appears normal (3
                        (orbits and profile)                           vessel cord)
 Lips:                  Appears normal         Kidneys:                Appear normal
 Palate:                Appears normal         Bladder:                Appears normal
 Thoracic:              Appears normal         Spine:                  Appears normal
 Heart:                 Appears normal; EIF    Upper Extremities:      Appears normal
 RVOT:                  Appears normal         Lower Extremities:      Appears normal

 Other:  Fetus appears to be a male. Parents do not wish to know sex of fetus
         at this time. Nasal bone visualized. Heels/feet and open hands/5th
         digits visualized. VC, 3VV and 3VTV visualized.
Cervix Uterus Adnexa

 Cervix
 Length:           3.68  cm.
 Normal appearance by transabdominal scan.

 Right Ovary
 Within normal limits.
 Left Ovary
 Within normal limits.
Impression

 G2 P0.  Patient is here for fetal anatomy scan.  She has early
 diagnosis of gestational diabetes that is well controlled on
 diet.  On serum screening (Tetra), risks of fetal aneuploidies
 and open neural tube defects are not increased.

 We performed fetal anatomy scan. An echogenic intracardiac
 focus is seen. No other makers of aneuploidies or fetal
 structural defects are seen. Fetal biometry is consistent with
 her previously-established dates. Amniotic fluid is normal and
 good fetal activity is seen.
 Marginal cord insertion is seen.
 I informed the patient that given that she had Calin Kv for fetal
 aneuploidies on quad screening, the finding of echogenic
 intracardiac focus should be considered a normal variant and
 that the risk of trisomy 21 is not increased. I also reassured
 that echogenic focus does not increase the risk of cardiac
 defects. I discussed the option of cell-free fetal DNA
 screening that has a greater detection rate of Down
 syndrome.I also informed her that only amniocentesis will
 give a defintive result on the fetal karyotype.
 Patient opted not to have cell-free fetal DNA screening or
 amniocentesis.

 Marginal cord insertion: I explained the finding that marginal
 cord insertion is not usually associated with fetal adverse
 outcomes. However, in some cases, it can be associated with
 fetal growth restriction. We recommend serial fetal growth
 assessments till delivery.

 "MyChart":Patient does not want to know fetal sex/gender.
 We informed her that fetal sex/gender will be mentioned in
 ultrasound report and will be in "MyChart" that will be
 accessible to the patient .
Recommendations

 -An appointment was made for her to return in 4 weeks for
 fetal growth assessment.
                 [place], [place]

## 2022-04-17 ENCOUNTER — Encounter: Payer: Self-pay | Admitting: *Deleted

## 2022-06-08 IMAGING — US US MFM FETAL BPP W/O NON-STRESS
1 series · 14 of 28 positions shown · non-contrast
Comparison: none

[Series 1: us mfm fetal bpp w/o non-stress · 36 acquisitions, 14 frames shown]
[im 2/36]
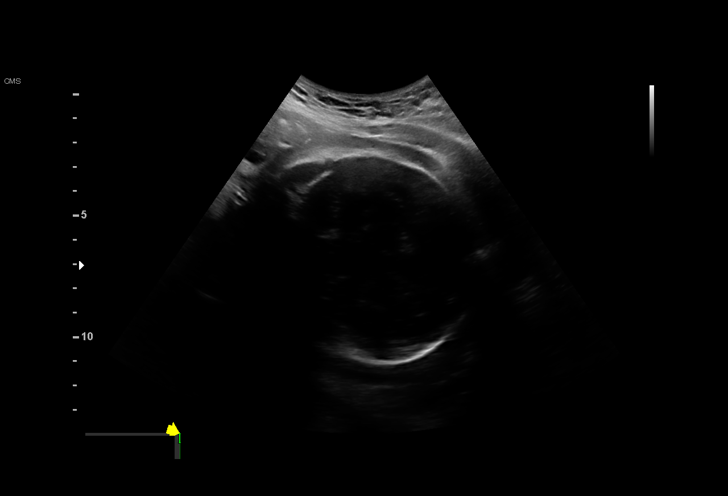
[im 4/36]
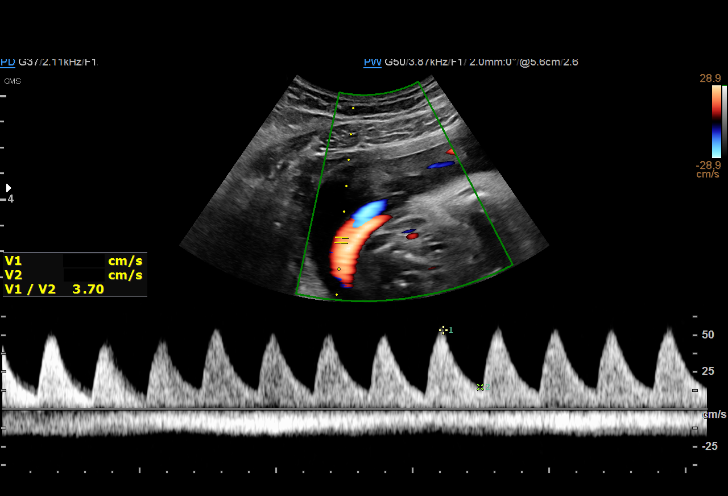
[im 7/36]
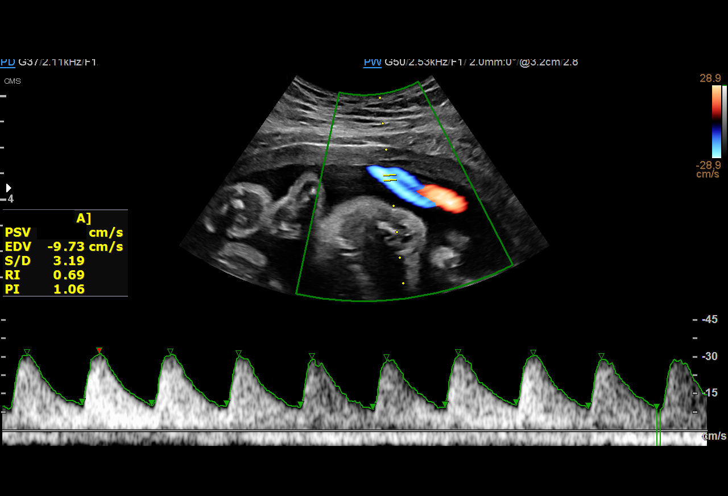
[im 10/36]
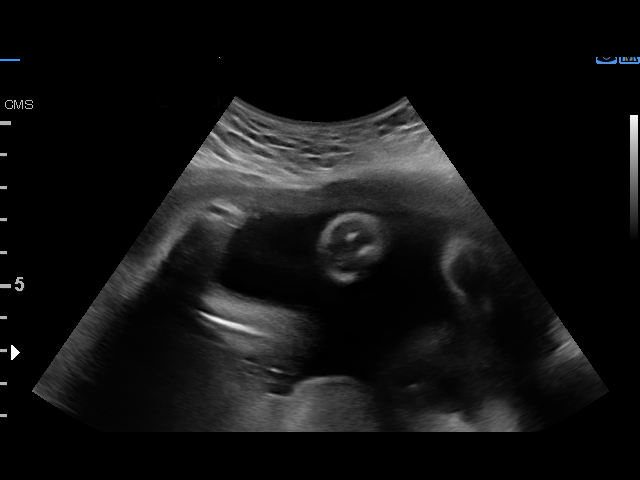
[im 12/36]
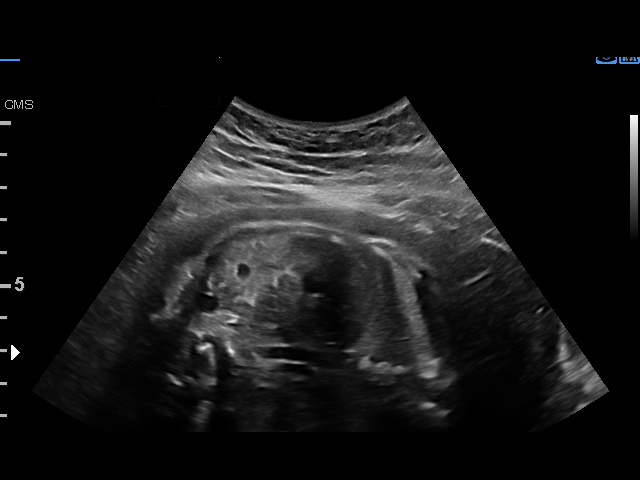
[im 15/36]
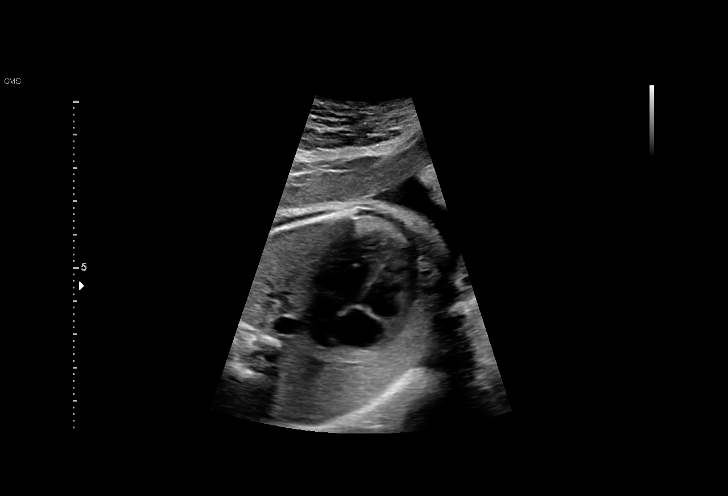
[im 17/36]
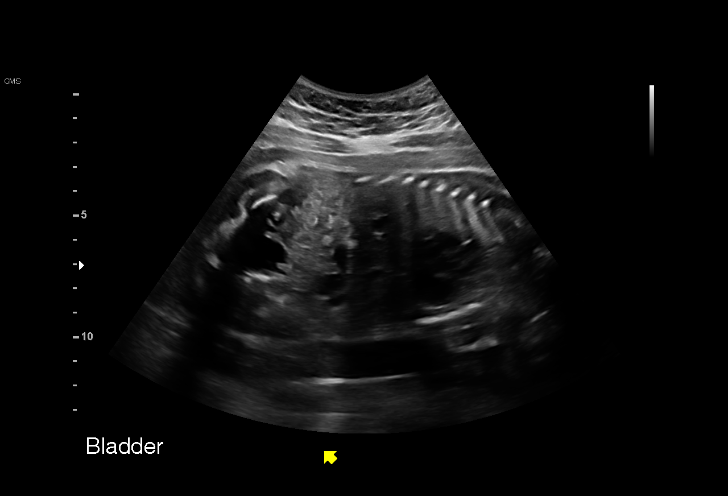
[im 20/36]
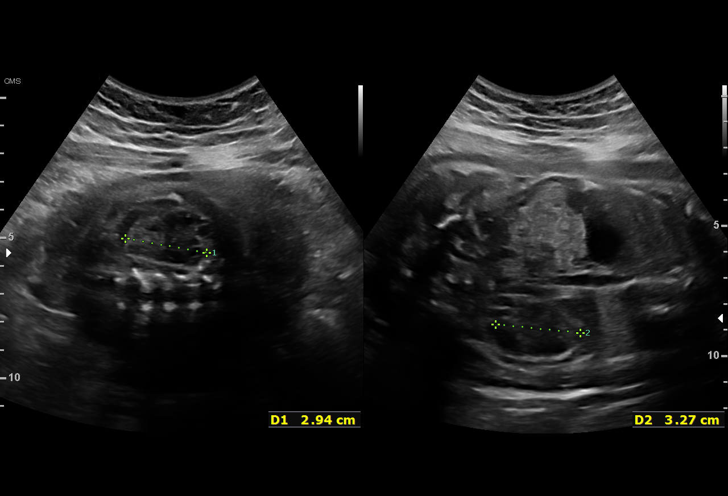
[im 23/36]
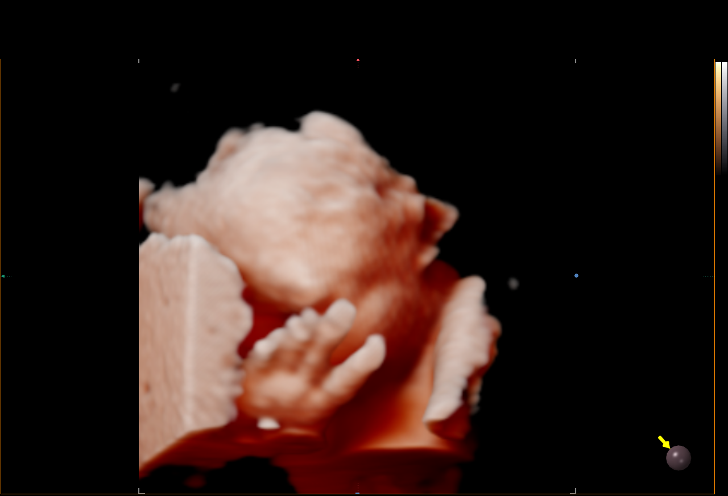
[im 25/36]
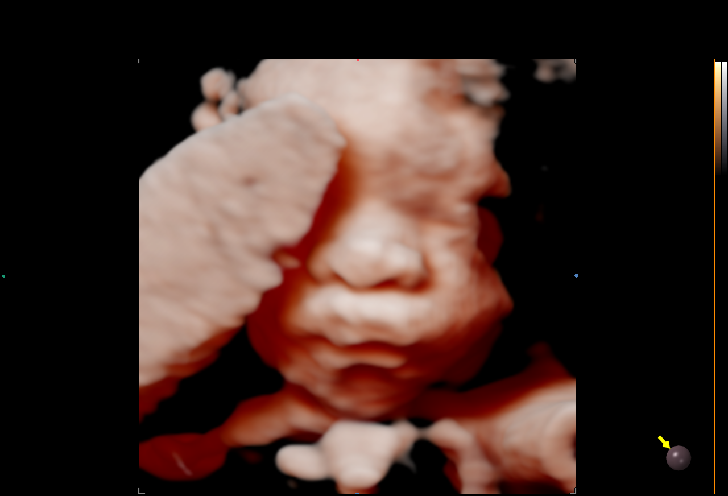
[im 28/36]
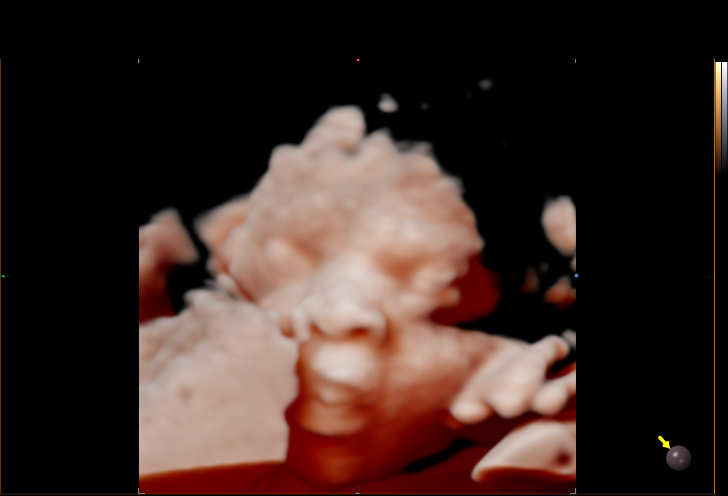
[im 30/36]
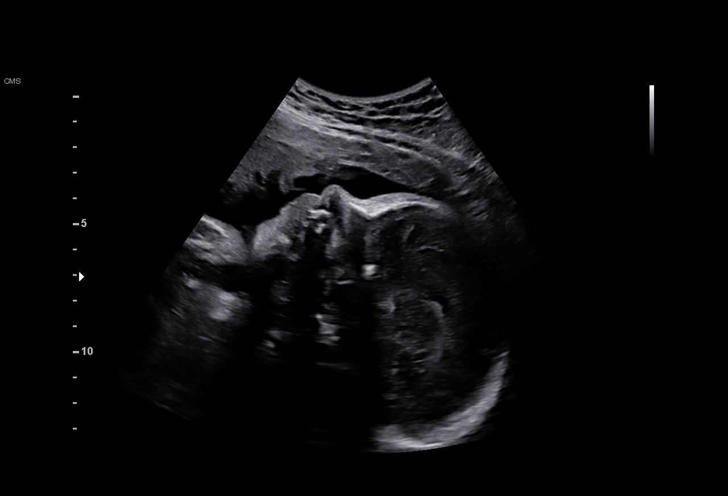
[im 33/36]
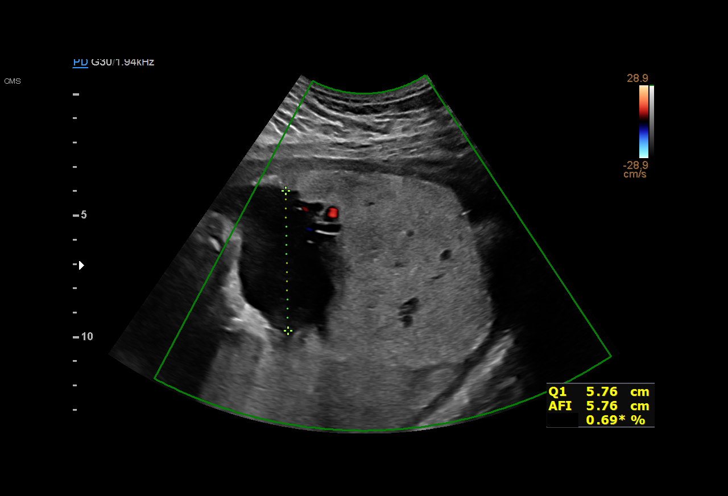
[im 36/36]
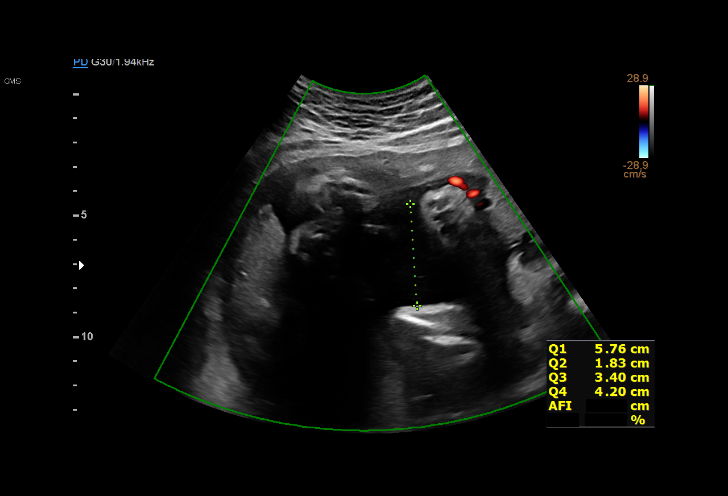

[14 of 28 positions shown; findings below may reference images not displayed]

2  US MFM UA CORD DOPPLER                76820.02    PALAK
                                                      FUSAGER

Indications

 31 weeks gestation of pregnancy
 Maternal care for known or suspected poor
 fetal growth, third trimester, not applicable or
 unspecified IUGR
 Obesity complicating pregnancy, third
 trimester (BMI 39)
 Gestational diabetes in pregnancy, diet
 controlled
 Quad - neg
 Marginal insertion of umbilical cord affecting
 management of mother in third trimester
 Encounter for other antenatal screening
 follow-up
Fetal Evaluation

 Num Of Fetuses:         1
 Fetal Heart Rate(bpm):  141
 Cardiac Activity:       Observed
 Presentation:           Cephalic
 Amniotic Fluid
 AFI FV:      Within normal limits

 AFI Sum(cm)     %Tile       Largest Pocket(cm)
 15.2            54

 RUQ(cm)       RLQ(cm)       LUQ(cm)        LLQ(cm)

Biophysical Evaluation

 Amniotic F.V:   Within normal limits       F. Tone:        Observed
 F. Movement:    Observed                   Score:          [DATE]
 F. Breathing:   Observed
OB History

 Gravidity:    2         Term:   0         SAB:   1
 Living:       0
Gestational Age

 LMP:           31w 6d        Date:  07/15/20                 EDD:   04/21/21
 Best:          31w 6d     Det. By:  LMP  (07/15/20)          EDD:   04/21/21
Doppler - Fetal Vessels

 Umbilical Artery
  S/D     %tile                                              ADFV    RDFV
   3.7       92                                                 No      No

Impression

 Ms. Stevo is a G2P0 who is here for antenatal testing given
 fetal growth restriction.
 Today the BPP was [DATE] with normal UA Dopplers ( previously
 abnormal) there was no evidence of AEDF or REDF.

 Ms. Henrie previous growth was EFW 3.1% with AC 2.9%

 Today her blood pressure was 143/88 and repeat of 133/93
 mmHg.

 I discussed that given the FGR of 3% we would recommend
 continue weekly testing with UA Dopplers with repeat growth
 in 3-4 weeks.
 Secondly, in review of her blood pressure over time she
 meets the diagnosis of gestational hypertension.
 She has not had recent labs and she is without s/sx of
 preeclampsia.

 I discussed the diagnosis, evaluation and management of
 gestational hypertension and preeclampsia to include weekly
 NST and monitoring of labs and blood pressure.
 We would recommend delivery at 37 weeks for mild disease
 and 34 weeks for disease with severe features.

 At this time I have asked Ms. Stevo to go to [facility] for
 preeclampsia labs to include CBC, CMP and UPC.
 She is scheduled for follow up in 1 week with growth.
Recommendations

 Repeat growth and testing including UA dopplers in 1 week.
 To [facility] for CBC, CMP and UPC.

## 2022-07-07 IMAGING — US US MFM FETAL BPP W/O NON-STRESS
1 series · 13 of 28 positions shown · non-contrast
Comparison: none

[Series 1: us mfm fetal bpp w/o non-stress · 65 acquisitions, 13 frames shown]
[im 3/65]
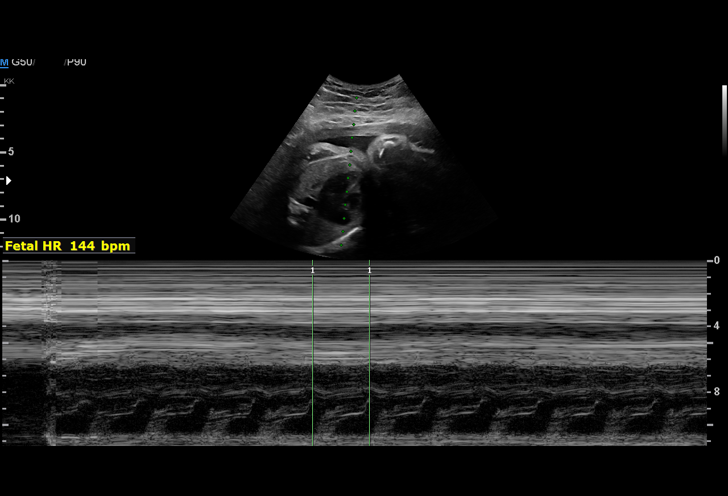
[im 8/65]
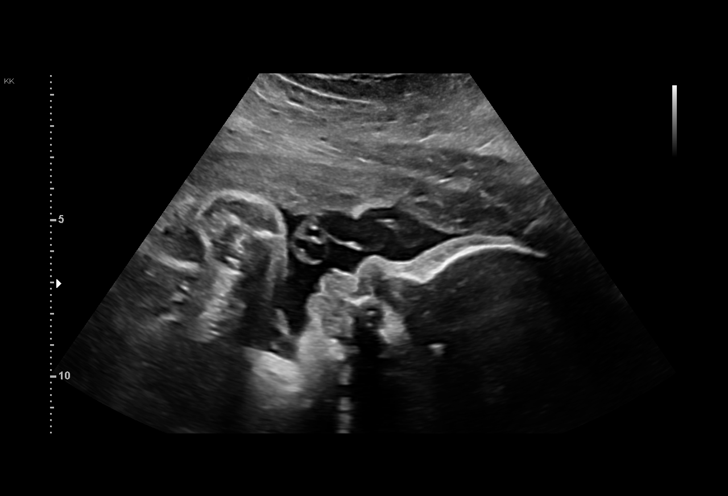
[im 12/65]
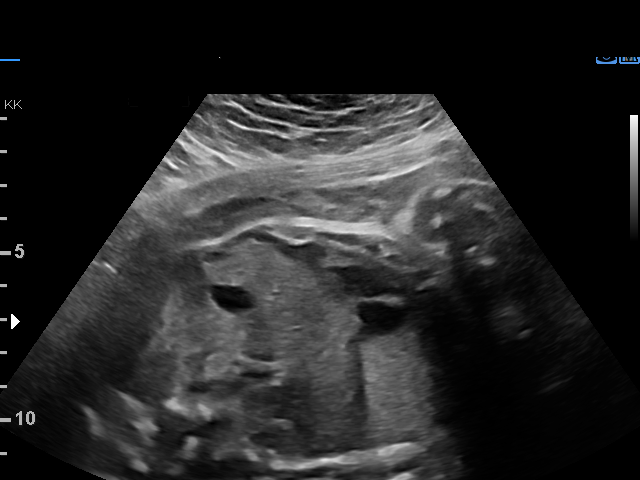
[im 17/65]
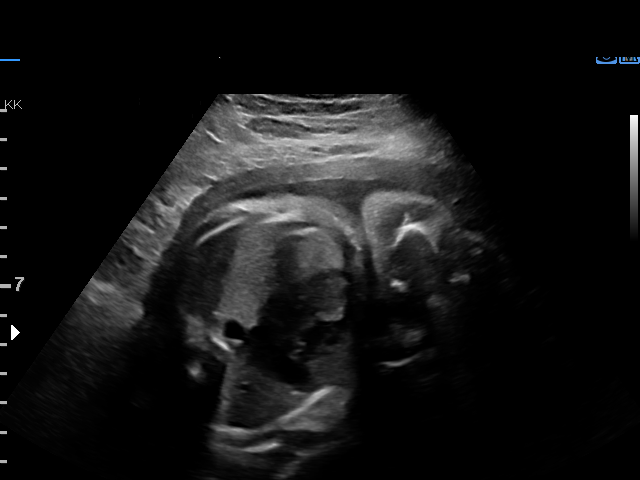
[im 22/65]
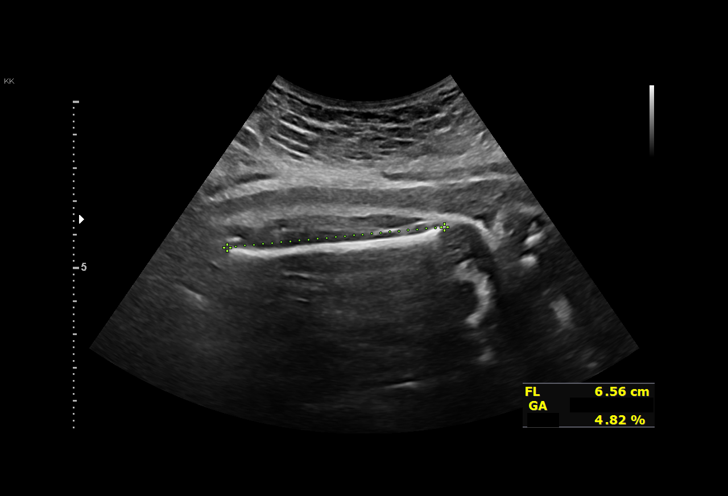
[im 27/65]
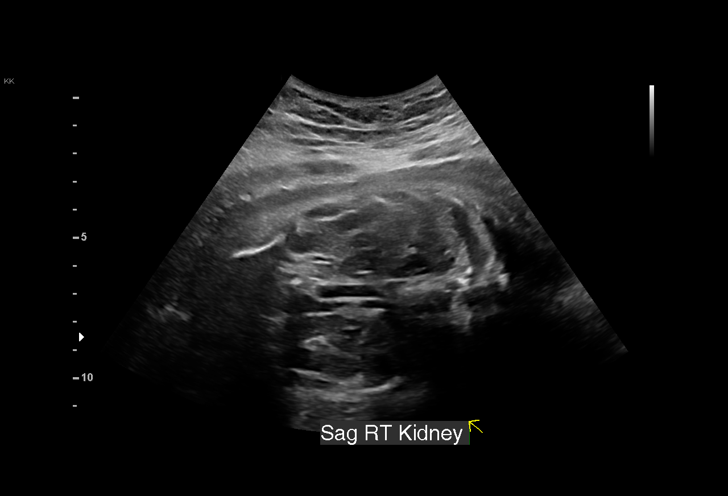
[im 34/65]
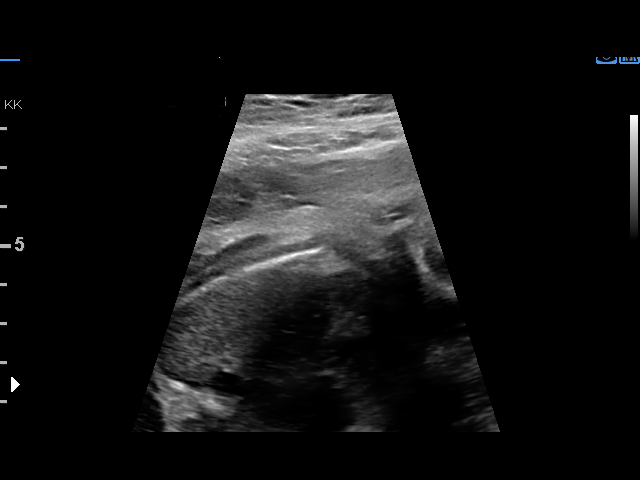
[im 38/65]
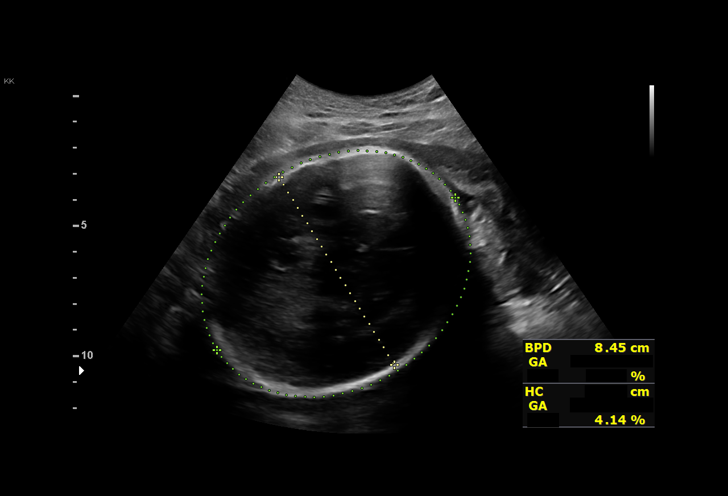
[im 43/65]
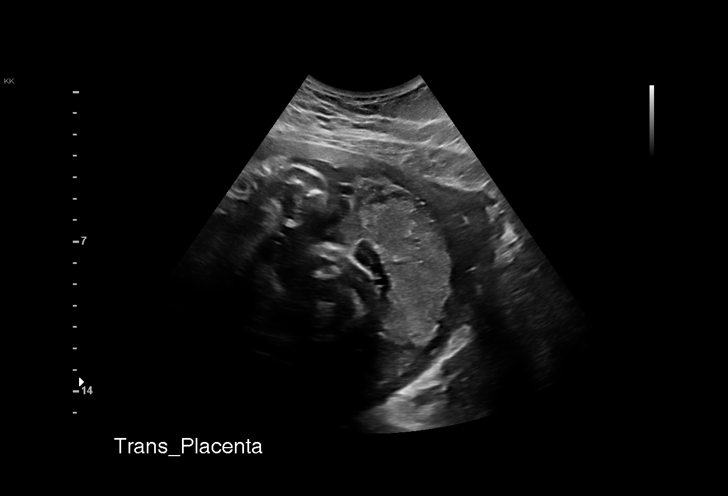
[im 48/65]
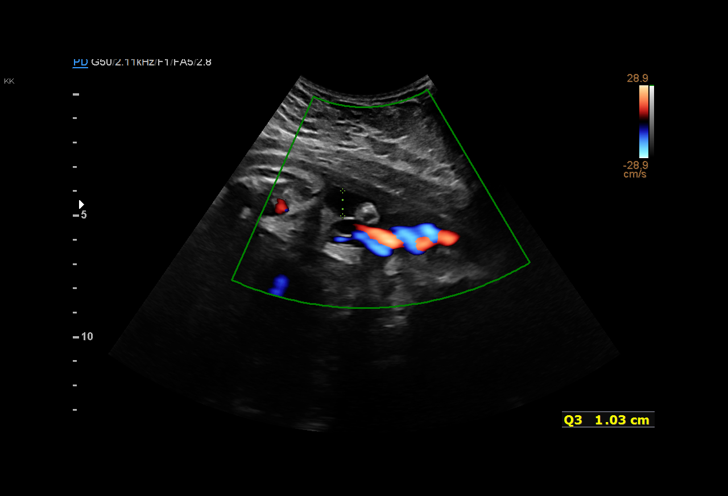
[im 53/65]
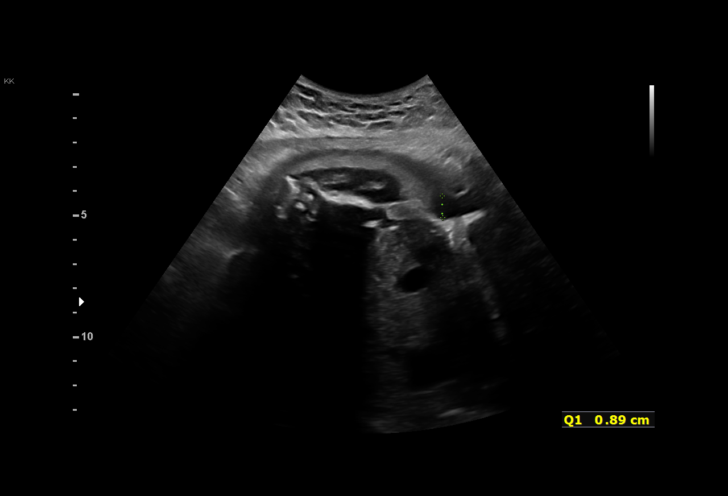
[im 57/65]
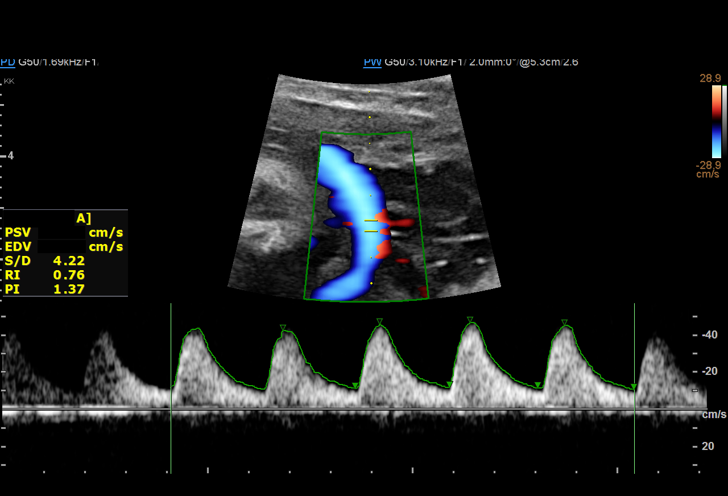
[im 62/65]
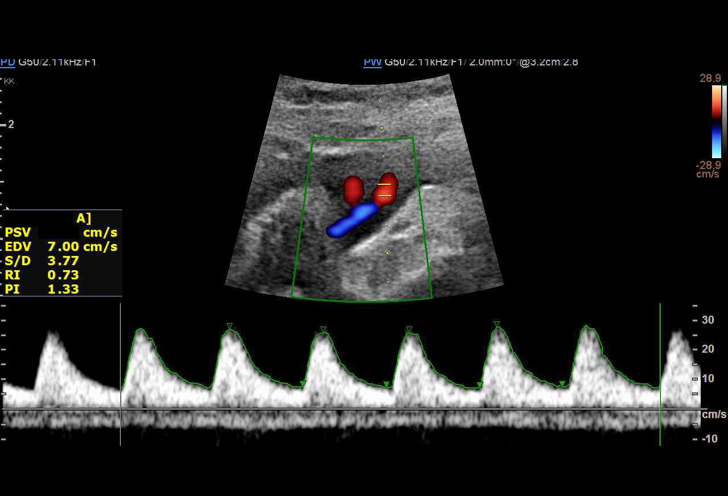

[13 of 28 positions shown; findings below may reference images not displayed]

2  US MFM UA CORD DOPPLER                76820.02    HERWANDI
                                                      NALANI
                                                      NALANI

Indications

 36 weeks gestation of pregnancy
 Maternal care for known or suspected poor
 fetal growth, third trimester, not applicable or
 unspecified IUGR
 Obesity complicating pregnancy, third
 trimester (BMI 39)
 Gestational diabetes in pregnancy, diet
 controlled
 Quad - neg
 Marginal insertion of umbilical cord affecting
 management of mother in third trimester
 Hypertension - Gestational
Fetal Evaluation

 Num Of Fetuses:         1
 Fetal Heart Rate(bpm):  144
 Cardiac Activity:       Observed
 Presentation:           Cephalic
 Placenta:               Posterior
 P. Cord Insertion:      Previously Visualized
 Amniotic Fluid
 AFI FV:      Subjectively decreased

 AFI Sum(cm)     %Tile       Largest Pocket(cm)
 5.5             < 3

 RUQ(cm)       RLQ(cm)       LUQ(cm)        LLQ(cm)

Biophysical Evaluation

 Amniotic F.V:   Pocket => 2 cm             F. Tone:        Observed
 F. Movement:    Observed                   Score:          [DATE]
 F. Breathing:   Observed
Biometry

 BPD:      85.1  mm     G. Age:  34w 2d         14  %    CI:        75.32   %    70 - 86
                                                         FL/HC:      20.9   %    20.1 -
 HC:       311   mm     G. Age:  34w 5d        4.5  %    HC/AC:      1.08        0.93 -
 AC:      287.8  mm     G. Age:  32w 6d        1.4  %    FL/BPD:     76.3   %    71 - 87
 FL:       64.9  mm     G. Age:  33w 3d        2.9  %    FL/AC:      22.6   %    20 - 24

 Est. FW:    5292  gm    4 lb 13 oz     3.6  %
OB History

 Gravidity:    2         Term:   0         SAB:   1
 Living:       0
Gestational Age

 LMP:           36w 0d        Date:  07/15/20                 EDD:   04/21/21
 U/S Today:     33w 6d                                        EDD:   05/06/21
 Best:          36w 0d     Det. By:  LMP  (07/15/20)          EDD:   04/21/21
Anatomy

 Cranium:               Appears normal         LVOT:                   Previously seen
 Cavum:                 Previously seen        Aortic Arch:            Previously seen
 Ventricles:            Previously seen        Ductal Arch:            Previously seen
 Choroid Plexus:        Previously seen        Diaphragm:              Appears normal
 Cerebellum:            Previously seen        Stomach:                Appears normal, left
                                                                       sided
 Posterior Fossa:       Previously seen        Abdomen:                Appears normal
 Nuchal Fold:           Not applicable (>20    Abdominal Wall:         Previously seen
                        wks GA)
 Face:                  Appears normal         Cord Vessels:           Previously seen
                        (orbits and profile)
 Lips:                  Appears normal         Kidneys:                Appear normal
 Palate:                Previously seen        Bladder:                Appears normal
 Thoracic:              Appears normal         Spine:                  Previously seen
 Heart:                 Appears normal         Upper Extremities:      Previously seen
 RVOT:                  Previously seen        Lower Extremities:      Previously seen

 Other:  Fetus previously appears to be a male. Nasal bone previously
         visualized. Heels/feet and open hands/5th digits previously visualized.
         VC, 3VV and 3VTV previously visualized.
Doppler - Fetal Vessels

 Umbilical Artery
  S/D     %tile      RI    %tile      PI    %tile            ADFV    RDFV
  3.61       96    0.72       96    1.28   > 97.5               No      No

Impression

 Severe fetal growth restriction.  On ultrasound performed on
 03/03/2021, the estimated fetal weight was at the 3rd
 percentile.
 Gestational hypertension.  Her blood pressure today at her
 office were 166/103 and 158/105 mmHg.  She does not have
 symptoms and signs of severe features of preeclampsia.
 Gestational diabetes.  Well-controlled on diet.
 On today's ultrasound, the estimated fetal weight is at the 4th
 percentile.  Interval weight gain is 569 g.  Amniotic fluid is
 decreased (AFI 5 cm).  Good fetal activity is present.
 Antenatal testing is reassuring.  BPP [DATE].  Umbilical artery
 Doppler showed increased S/D ratio.
 I explained the finding of severe fetal growth restriction.  She
 has 1 blood pressure in the severe hypertensive range.  I
 recommended that she be evaluated the [facility] and that if she
 has severe range hypertension she would be delivered now
Recommendations

 -[facility] evaluation.
 -If patient is discharged, delivery at 37 weeks gestation.
                 Matz, [employer]

## 2024-05-29 ENCOUNTER — Encounter: Payer: Self-pay | Admitting: Advanced Practice Midwife
